# Patient Record
Sex: Female | Born: 1940 | Race: Asian | Hispanic: No | Marital: Married | Smoking: Never smoker
Health system: Southern US, Community
[De-identification: ages and names within clinical notes are randomized; demographics above are authoritative.]

## PROBLEM LIST (undated history)

## (undated) DIAGNOSIS — C801 Malignant (primary) neoplasm, unspecified: Secondary | ICD-10-CM

## (undated) DIAGNOSIS — M810 Age-related osteoporosis without current pathological fracture: Secondary | ICD-10-CM

## (undated) DIAGNOSIS — E079 Disorder of thyroid, unspecified: Secondary | ICD-10-CM

## (undated) DIAGNOSIS — T7840XA Allergy, unspecified, initial encounter: Secondary | ICD-10-CM

## (undated) DIAGNOSIS — E7800 Pure hypercholesterolemia, unspecified: Secondary | ICD-10-CM

## (undated) DIAGNOSIS — I10 Essential (primary) hypertension: Secondary | ICD-10-CM

## (undated) DIAGNOSIS — H269 Unspecified cataract: Secondary | ICD-10-CM

## (undated) DIAGNOSIS — K219 Gastro-esophageal reflux disease without esophagitis: Secondary | ICD-10-CM

## (undated) DIAGNOSIS — M1990 Unspecified osteoarthritis, unspecified site: Secondary | ICD-10-CM

## (undated) HISTORY — DX: Malignant (primary) neoplasm, unspecified: C80.1

## (undated) HISTORY — PX: ABDOMINAL HYSTERECTOMY: SHX81

## (undated) HISTORY — DX: Gastro-esophageal reflux disease without esophagitis: K21.9

## (undated) HISTORY — DX: Unspecified osteoarthritis, unspecified site: M19.90

## (undated) HISTORY — DX: Disorder of thyroid, unspecified: E07.9

## (undated) HISTORY — PX: EYE SURGERY: SHX253

## (undated) HISTORY — DX: Age-related osteoporosis without current pathological fracture: M81.0

## (undated) HISTORY — PX: BREAST BIOPSY: SHX20

## (undated) HISTORY — DX: Unspecified cataract: H26.9

## (undated) HISTORY — DX: Allergy, unspecified, initial encounter: T78.40XA

## (undated) HISTORY — PX: BREAST EXCISIONAL BIOPSY: SUR124

---

## 1998-08-22 ENCOUNTER — Other Ambulatory Visit: Admission: RE | Admit: 1998-08-22 | Discharge: 1998-08-22 | Payer: Self-pay | Admitting: Obstetrics and Gynecology

## 1998-09-19 ENCOUNTER — Ambulatory Visit (HOSPITAL_COMMUNITY): Admission: RE | Admit: 1998-09-19 | Discharge: 1998-09-19 | Payer: Self-pay | Admitting: Obstetrics and Gynecology

## 1999-09-10 ENCOUNTER — Other Ambulatory Visit: Admission: RE | Admit: 1999-09-10 | Discharge: 1999-09-10 | Payer: Self-pay | Admitting: Obstetrics and Gynecology

## 2001-01-18 ENCOUNTER — Ambulatory Visit (HOSPITAL_COMMUNITY): Admission: RE | Admit: 2001-01-18 | Discharge: 2001-01-18 | Payer: Self-pay | Admitting: Family Medicine

## 2001-01-18 ENCOUNTER — Encounter: Payer: Self-pay | Admitting: Family Medicine

## 2001-03-13 ENCOUNTER — Other Ambulatory Visit: Admission: RE | Admit: 2001-03-13 | Discharge: 2001-03-13 | Payer: Self-pay | Admitting: *Deleted

## 2001-12-27 ENCOUNTER — Inpatient Hospital Stay (HOSPITAL_COMMUNITY): Admission: EM | Admit: 2001-12-27 | Discharge: 2001-12-28 | Payer: Self-pay

## 2001-12-27 ENCOUNTER — Encounter: Payer: Self-pay | Admitting: *Deleted

## 2003-01-03 ENCOUNTER — Other Ambulatory Visit: Admission: RE | Admit: 2003-01-03 | Discharge: 2003-01-03 | Payer: Self-pay | Admitting: *Deleted

## 2005-09-14 ENCOUNTER — Encounter (INDEPENDENT_AMBULATORY_CARE_PROVIDER_SITE_OTHER): Payer: Self-pay | Admitting: *Deleted

## 2005-09-14 ENCOUNTER — Ambulatory Visit (HOSPITAL_COMMUNITY): Admission: RE | Admit: 2005-09-14 | Discharge: 2005-09-14 | Payer: Self-pay | Admitting: Gastroenterology

## 2005-09-23 ENCOUNTER — Emergency Department (HOSPITAL_COMMUNITY): Admission: EM | Admit: 2005-09-23 | Discharge: 2005-09-24 | Payer: Self-pay | Admitting: Emergency Medicine

## 2006-08-17 ENCOUNTER — Other Ambulatory Visit: Admission: RE | Admit: 2006-08-17 | Discharge: 2006-08-17 | Payer: Self-pay | Admitting: *Deleted

## 2007-12-27 ENCOUNTER — Encounter: Admission: RE | Admit: 2007-12-27 | Discharge: 2007-12-27 | Payer: Self-pay | Admitting: Family Medicine

## 2008-03-07 ENCOUNTER — Encounter: Admission: RE | Admit: 2008-03-07 | Discharge: 2008-03-07 | Payer: Self-pay | Admitting: Family Medicine

## 2008-10-08 ENCOUNTER — Encounter: Payer: Self-pay | Admitting: Cardiology

## 2008-11-25 ENCOUNTER — Encounter: Payer: Self-pay | Admitting: Cardiology

## 2008-12-30 ENCOUNTER — Encounter: Admission: RE | Admit: 2008-12-30 | Discharge: 2008-12-30 | Payer: Self-pay | Admitting: Family Medicine

## 2009-05-01 ENCOUNTER — Encounter: Payer: Self-pay | Admitting: Cardiology

## 2009-11-11 DIAGNOSIS — E785 Hyperlipidemia, unspecified: Secondary | ICD-10-CM | POA: Insufficient documentation

## 2009-11-11 DIAGNOSIS — I10 Essential (primary) hypertension: Secondary | ICD-10-CM | POA: Insufficient documentation

## 2009-11-12 ENCOUNTER — Ambulatory Visit: Payer: Self-pay | Admitting: Cardiology

## 2009-11-12 DIAGNOSIS — R0989 Other specified symptoms and signs involving the circulatory and respiratory systems: Secondary | ICD-10-CM | POA: Insufficient documentation

## 2009-11-12 DIAGNOSIS — I2510 Atherosclerotic heart disease of native coronary artery without angina pectoris: Secondary | ICD-10-CM | POA: Insufficient documentation

## 2009-11-13 ENCOUNTER — Telehealth: Payer: Self-pay | Admitting: Cardiology

## 2009-12-02 ENCOUNTER — Ambulatory Visit: Payer: Self-pay

## 2009-12-02 ENCOUNTER — Ambulatory Visit: Payer: Self-pay | Admitting: Cardiology

## 2009-12-02 ENCOUNTER — Encounter: Payer: Self-pay | Admitting: Cardiology

## 2009-12-02 ENCOUNTER — Encounter (HOSPITAL_COMMUNITY)
Admission: RE | Admit: 2009-12-02 | Discharge: 2010-02-10 | Payer: Self-pay | Source: Home / Self Care | Admitting: Cardiology

## 2010-01-02 ENCOUNTER — Encounter: Admission: RE | Admit: 2010-01-02 | Discharge: 2010-01-02 | Payer: Self-pay | Admitting: Family Medicine

## 2010-02-19 ENCOUNTER — Telehealth (INDEPENDENT_AMBULATORY_CARE_PROVIDER_SITE_OTHER): Payer: Self-pay | Admitting: *Deleted

## 2010-06-07 ENCOUNTER — Encounter: Payer: Self-pay | Admitting: Family Medicine

## 2010-06-14 LAB — CONVERTED CEMR LAB
ALT: 18 units/L (ref 0–35)
AST: 23 units/L (ref 0–37)
Albumin: 4.4 g/dL (ref 3.5–5.2)
Alkaline Phosphatase: 41 units/L (ref 39–117)
BUN: 13 mg/dL (ref 6–23)
Bilirubin, Direct: 0.2 mg/dL (ref 0.0–0.3)
CO2: 31 meq/L (ref 19–32)
Calcium: 9.1 mg/dL (ref 8.4–10.5)
Chloride: 105 meq/L (ref 96–112)
Cholesterol: 168 mg/dL (ref 0–200)
Creatinine, Ser: 0.7 mg/dL (ref 0.4–1.2)
GFR calc non Af Amer: 91.8 mL/min (ref >60)
Glucose, Bld: 85 mg/dL (ref 70–99)
HDL: 52.1 mg/dL (ref >39.00)
LDL Cholesterol: 79 mg/dL (ref 0–99)
Potassium: 4.2 meq/L (ref 3.5–5.1)
Sodium: 144 meq/L (ref 135–145)
Total Bilirubin: 0.5 mg/dL (ref 0.3–1.2)
Total CHOL/HDL Ratio: 3
Total Protein: 7.2 g/dL (ref 6.0–8.3)
Triglycerides: 183 mg/dL — ABNORMAL HIGH (ref 0.0–149.0)
VLDL: 36.6 mg/dL (ref 0.0–40.0)
Vit D, 25-Hydroxy: 30 ng/mL (ref 30–89)

## 2010-06-18 NOTE — Progress Notes (Signed)
Summary: son's cell  Phone Note Call from Patient   Caller: Patient Reason for Call: Talk to Doctor Summary of Call: Calling Dr Antoine Poche back to give him her sons cell Lalonnie Shaffer [phone] Initial call taken by: Migdalia Dk,  November 13, 2009 8:36 AM  Follow-up for Phone Call        Spoke with pt. Pt. states Dr. Antoine Poche asked her for her son's cell phone # yesterday during clinic visit. Pt's son  Cell phone # is [phone]. I let pt. know will send it  to MD's desktop. Follow-up by: Ollen Gross, RN, BSN,  November 13, 2009 9:31 AM

## 2010-06-18 NOTE — Progress Notes (Signed)
  Walk in Patient Form Recieved "pt nees lipitor filled" sent to [facility]  February 19, 2010 3:51 PM

## 2010-06-18 NOTE — Letter (Signed)
Summary: [facility] Progress [facility]  [facility] Progress Note   Imported By: Roderic Ovens 11/14/2009 09:38:47  _____________________________________________________________________  External Attachment:    Type:   Image     Comment:   External Document

## 2010-06-18 NOTE — Letter (Signed)
Summary: [name] Progress Note  [facility] Progress Note   Imported By: Roderic Ovens 11/14/2009 09:39:12  _____________________________________________________________________  External Attachment:    Type:   Image     Comment:   External Document

## 2010-06-18 NOTE — Assessment & Plan Note (Signed)
Summary: ec6/reestablish   Visit Type:  Initial Consult Primary Provider:  Dr. Juluis Rainier  CC:  CAD.  History of Present Illness: The patient presents for followup after an 8 year absence. She has a history of nonobstructive coronary artery disease. At the time of that evaluation she was having palpitations but she is no longer bothered by these. She is somewhat active. She walks the golf course 3 times per week with her husband. With this level of activity she gets no chest discomfort, neck or arm discomfort. She denies any palpitations, presyncope or syncope. She has no shortness of breath, PND or orthopnea. She said that since she retired the palpitations that bothered her in the past are no longer a problem.  Current Medications (verified): 1)  Enalapril Maleate 20 Mg Tabs (Enalapril Maleate) .Marland Kitchen.. 1 By Mouth Daily 2)  Metoprolol Succinate 25 Mg Xr24h-Tab (Metoprolol Succinate) .Marland Kitchen.. 1 P Odialy 3)  Lipitor 10 Mg Tabs (Atorvastatin Calcium) .Marland Kitchen.. 1 By Mouth Every Other Day 4)  Aspirin 81 Mg  Tabs (Aspirin) .Marland Kitchen.. 1 By Mouth Dialy 5)  Fish Oil   Oil (Fish Oil) .... 3 By Mouth Daily 6)  Vitamin C 500 Mg  Tabs (Ascorbic Acid) .Marland Kitchen.. 1 By Mouth Dialy 7)  Multivitamins   Tabs (Multiple Vitamin) .Marland Kitchen.. 1 By Mouth Daily  Allergies (verified): 1)  ! * Ivp Dye 2)  ! Epinephrine  Past History:  Past Medical History: Hypertension Dyslipidemia CAD (PDA 50% stenosis, diagonal 30% stenosis. Catheterization 2003)  Past Surgical History: Hysterectomy 1994.   Lumpectomy (benign)     Family History:  Father died in an auto accident.  Mother died at age 47 of  diabetes mellitus.  She has a brother, age 3, whose wife has a history of myocardial infarction, diabetes, and underwent CABG.  One sister, age 70, who died of myocardial infarction.  Another brother live and well, and two other sisters live and well without medical problems.  Social History:  Patient lives in [place] with her  husband.  She denies any alcohol, tobacco, or drug use.   Review of Systems       Positive for reflux, joint pains. Otherwise as stated in the history of present illness negative for all other systems.  Vital Signs:  Patient profile:   70 year old female Height:      65 inches Weight:      158 pounds BMI:     26.39 Pulse rate:   67 / minute Resp:     16 per minute BP sitting:   181 / 89  (right arm)  Vitals Entered By: Marrion Coy, CNA (November 12, 2009 11:47 AM)  Physical Exam  General:  Well developed, well nourished, in no acute distress. Head:  normocephalic and atraumatic Eyes:  PERRLA/EOM intact; conjunctiva and lids normal. Mouth:  Teeth, gums and palate normal. Oral mucosa normal. Neck:  Neck supple, no JVD. No masses, thyromegaly or abnormal cervical nodes. Chest Wall:  no deformities or breast masses noted Lungs:  Clear bilaterally to auscultation and percussion. Abdomen:  Bowel sounds positive; abdomen soft and non-tender without masses, organomegaly, or hernias noted. No hepatosplenomegaly. Msk:  Back normal, normal gait. Muscle strength and tone normal. Extremities:  No clubbing or cyanosis. Neurologic:  Alert and oriented x 3. Skin:  Intact without lesions or rashes. Cervical Nodes:  no significant adenopathy Axillary Nodes:  no significant adenopathy Inguinal Nodes:  no significant adenopathy Psych:  Normal affect.   Detailed Cardiovascular  Exam  Neck    Carotids: soft right carotid bruit    Neck Veins: Normal, no JVD.    Heart    Inspection: no deformities or lifts noted.      Palpation: normal PMI with no thrills palpable.      Auscultation: regular rate and rhythm, S1, S2 without murmurs, rubs, gallops, or clicks.    Vascular    Abdominal Aorta: no palpable masses, pulsations, or audible bruits.      Femoral Pulses: normal femoral pulses bilaterally.      Pedal Pulses: normal pedal pulses bilaterally.      Radial Pulses: normal radial pulses  bilaterally.      Peripheral Circulation: no clubbing, cyanosis, or edema noted with normal capillary refill.     EKG  Procedure date:  11/12/2009  Findings:      Sinus rhythm rate 67, axis within normal limits, QT slightly prolonged, no acute ST-T wave changes  Impression & Recommendations:  Problem # 1:  CORONARY ATHEROSCLEROSIS NATIVE CORONARY ARTERY (ICD-414.01) The patient has no new symptoms. I would like to screen her with an exercise treadmill test to rule out progression, risk stratify and give her a prescription for exercise. She will continue with risk reduction. Orders: EKG w/ Interpretation (93000) Treadmill (Treadmill)  Problem # 2:  CAROTID BRUIT (ICD-785.9) I will followup with carotid Doppler. Orders: Carotid Duplex (Carotid Duplex)  Problem # 3:  HYPERTENSION (ICD-401.9) Her blood pressure is slightly elevated. However, she reports it to be well controlled at home. I gave her instructions on how to keep a blood pressure diary. She will do this and presented to my office. Further management will be based on this. Orders: Treadmill (Treadmill) T-Assay of Vitamin D (04540-98119) TLB-BMP (Basic Metabolic Panel-BMET) (80048-METABOL)  Problem # 4:  DYSLIPIDEMIA (ICD-272.4) She asked for a repeat lipid profile. This was last checked in December. The HDL was 58 with an LDL of 79. This would be an acceptable ratio. I will share this information with her primary physician. Orders: TLB-Lipid Panel (80061-LIPID) TLB-Hepatic/Liver Function Pnl (80076-HEPATIC)  Patient Instructions: 1)  Your physician has requested that you have a carotid duplex. This test is an ultrasound of the carotid arteries in your neck. It looks at blood flow through these arteries that supply the brain with blood. Allow one hour for this exam. There are no restrictions or special instructions. 2)  Your physician has requested that you have an exercise tolerance test.  For further information please  visit [email or web address]  Please also follow instruction sheet, as given.

## 2010-06-18 NOTE — Letter (Signed)
Summary: [facility] Note   [facility] Progress Note   Imported By: Roderic Ovens 11/14/2009 09:39:42  _____________________________________________________________________  External Attachment:    Type:   Image     Comment:   External Document

## 2010-10-02 NOTE — Op Note (Signed)
Janice, [name] NO.:  [id number]   MEDICAL RECORD NO.:  [id number]          PATIENT TYPE:  AMB   LOCATION:  ENDO                         FACILITY:  [facility]   PHYSICIAN:  Bernette Redbird, M.D.   DATE OF BIRTH:  11-15-1940   DATE OF PROCEDURE:  09/14/2005  DATE OF DISCHARGE:                                 OPERATIVE REPORT   PROCEDURE:  Colonoscopy.   INDICATIONS:  Screening for colon cancer in a standard risk 70 year old  [place] immigrant.   FINDINGS:  Normal exam to the terminal ileum.   PROCEDURE:  The nature, purpose, and risks of the procedure had been  discussed with the patient who provided written consent.  Sedation for this  procedure and the upper endoscopy which preceded it totalled fentanyl 75 mcg  and Versed 7 mg IV without arrhythmias or desaturation.   The Olympus adjustable tension pediatric video colonoscope was advanced with  just some slight looping through the sigmoid region to the terminal ileum  which had a normal appearance and pullback was then performed.  The quality  of the prep was very good and it is felt that all areas were well seen.  This was a normal examination, specifically without evidence of polyps,  cancer, colitis, vascular malformations or diverticulosis.  Reinspection of  the rectum and a retroflexed view of the rectum were unremarkable.  No  biopsies were obtained.  The patient tolerated the procedure well and there  no apparent complications.   IMPRESSION:  Normal screening colonoscopy in a standard risk individual  (V76.51).   PLAN:  Repeat colonoscopy in ten years.           ______________________________  Bernette Redbird, M.D.     RB/MEDQ  D:  09/14/2005  T:  09/14/2005  Job:  [id number]   cc:   Susa Raring, M.D.

## 2010-10-02 NOTE — Op Note (Signed)
NAMEHAJER, DWYER NO.:  [id number]   MEDICAL RECORD NO.:  [id number]          PATIENT TYPE:  AMB   LOCATION:  ENDO                         FACILITY:  [facility]   PHYSICIAN:  Bernette Redbird, M.D.   DATE OF BIRTH:  04-12-1941   DATE OF PROCEDURE:  09/14/2005  DATE OF DISCHARGE:                                 OPERATIVE REPORT   PROCEDURE:  Upper endoscopy with biopsy.   INDICATIONS:  70 year old her [place] immigrant with nonspecific upper tract  discomfort or dyspepsia.   FINDINGS:  Unremarkable exam.  Small antral nodule and fundic polyp present.   PROCEDURE:  The nature, purpose and risks of the procedure have been  discussed with the patient who provided written consent.  Sedation was  fentanyl 50 mcg and Versed 3 mg IV without arrhythmias or desaturation.  The  Olympus standard adult video endoscope was passed under direct vision.  The  vocal cords were not well seen.  The esophagus was readily entered and had  entirely normal mucosa without evidence of reflux esophagitis, Barrett's  esophagus, varices, infection, neoplasia or any ring, stricture or hiatal  hernia.   The stomach contained no significant residual.  The mucosa was normal,  specifically without evidence of gastritis or erosions.   In the antrum was a roughly 4 mm aspect in the prepyloric region.  It did  not look pathologic but I took a couple of biopsies of it as added  reassurance.  Retroflexed viewing of the cardia was normal but there was a  roughly 4-mm polyp on a fold in the fundic region consistent with a fundic  gland polyp, of which several biopsies were obtained.  I also obtained  random antral biopsies looking for evidence of H pylori infection.   The pylorus, duodenal bulb and second duodenum looked normal.   The patient tolerated the procedure well and there were no apparent  complications.   IMPRESSION:  Essentially normal upper endoscopy.  No change in therapy is  mandated  based on the endoscopic findings, but if H. pylori is confirmed to  be present, consideration might be given to its treatment to help prevent  risks of gastric cancer which, in this [place] patient, might be higher  than the standard [place] patient.           ______________________________  Bernette Redbird, M.D.     RB/MEDQ  D:  09/14/2005  T:  09/14/2005  Job:  [id number]   cc:   Susa Raring, M.D.

## 2010-10-02 NOTE — Cardiovascular Report (Signed)
[name], [name]                            ACCOUNT NO.:  [id number]   MEDICAL RECORD NO.:  [id number]                   PATIENT TYPE:  INP   LOCATION:  2905                                 FACILITY:  [facility]   PHYSICIAN:  Everardo Beals. Juanda Chance, M.D. 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  Her son is a cardiology fellow at  [employer].  She has a history of hypertension, tachycardia, and  hyperlipidemia, and a positive family history for coronary disease.  She was  admitted with tightness in her chest and with a concern that this might  represent unstable angina.  Enzymes were negative.  Because of persistent  pain, she was scheduled for evaluation with catheterization today.   DESCRIPTION OF PROCEDURE:  The procedure was performed via the right femoral  artery using an arterial sheath and #6 [place] preformed coronary catheters.  A front wall arterial puncture was performed and Omnipaque contrast was  used.  A distal aortogram was performed to rule out renovascular causes for  hypertension.  She tolerated the procedure well and left the laboratory in  satisfactory condition.   RESULTS:  1. Left main coronary artery:  The left main coronary artery was free of     significant disease.  2. Left anterior descending artery:  The left anterior descending artery     gave rise to three septal perforators and two diagonal branches.  There     was 30% proximal and 30% mid stenosis in the LAD.  3. Circumflex artery:  The circumflex artery gave rise to an intermedius     branch, an atrial branch, a small marginal branch, and two posterolateral     branches.  The circumflex is irregular but there is no significant     obstruction.  4. Right coronary artery:  The right  coronary artery is a moderate-sized     vessel that gave rise to a conus branch, a right ventricular branch, a     posterior descending branch, and three posterolateral branches.  There is     irregularity in the proximal portion of the vessel.  There was 50%     narrowing at the ostium of the posterior descending branch.  5. Left ventriculogram:  The left ventriculogram, performed in the RAO     projection, showed good wall motion with no areas of hypokinesis.  The     estimated ejection fraction was 60%.  6. Distal aortogram:  A distal aortogram was performed which showed patent     renal arteries and no significant aortoiliac obstruction.  7. The aortic pressure was 150/72 with a mean of 103.  The left ventricular  pressure was 150/13.   CONCLUSION:  Nonobstructive coronary artery disease with 30% proximal and  30% mid stenosis in the left anterior descending artery, irregularities in  the circumflex artery, 50% narrowing at the ostium of the posterior  descending branch of the right coronary artery, and normal left ventricular  function.   RECOMMENDATIONS:  The lesions do not appear to be flow-limiting and seem  unlikely to be responsible for the patient's symptoms.  We will plan  continued secondary risk factor modification.  I spoke with Dr. Antoine Poche who  thinks the symptoms may be GI, and I will talk to the patient further  regarding this and will probably start her on a proton pump inhibitor along  with her admission medications plus aspirin now that she has documented  nonobstructive coronary disease.  I will arrange followup with Dr. Dayton Scrape.                                                 Bruce Elvera Lennox Juanda Chance, M.D. [facility]    BRB/MEDQ  D:  12/28/2001  T:  12/31/2001  Job:  [id number]   cc:   Meredith Staggers, M.D.   Veneda Melter, M.D. [facility]   Cardiac Catheterization Lab

## 2010-10-02 NOTE — H&P (Signed)
[name], [name]                            ACCOUNT NO.:  [id number]   MEDICAL RECORD NO.:  [id number]                   PATIENT TYPE:  INP   LOCATION:  2905                                 FACILITY:  [facility]   PHYSICIAN:  Veneda Melter, M.D. [facility]               DATE OF BIRTH:  [birth year]   DATE OF ADMISSION:  12/26/2001  DATE OF DISCHARGE:  12/28/2001                                HISTORY & PHYSICAL   CHIEF COMPLAINT:  Chest pressure.   HISTORY:  The patient is a 70 year old Asian female with history of  hypertension and dyslipidemia, who presents today with complaints of  substernal chest pressure and mild shortness of breath.  The patient noted  some discomfort in her mid-sternum last night while walking.  The patient  thought nothing of this and went to sleep, although she had somewhat of a  restless night.  She did not feel quite well.  During the day today while  working approximately at noon, she had onset of chest pressure again with  some mild shortness of breath and light-headedness.  This lasted  approximately two hours.  Patient left work early, went home and slept.  Later upon awakening at approximately dinnertime, still felt somewhat short  of breath as though she could not catch her breath.  She presented to [facility].  She was found to be significantly hypertensive and was  referred to [facility] emergency room.  Currently the patient complains of mild  mid-sternal chest pressure, no shortness of breath, no light-headedness.  She denies any nausea and vomiting,  no diaphoresis, no recent history of  neurologic deficits.  She denies any recent ______________, no hematuria,  dysuria, fever, chills or other constitutional symptoms.  She has not had  any exertional chest pain in the recent past, shortness of breath,  orthopnea, paroxysmal nocturnal dyspnea.   REVIEW OF SYSTEMS:  Otherwise noncontributory.   PAST MEDICAL HISTORY:  Noted for hysterectomy in 1994.   She has been on  Premarin since for significant perimenopausal symptoms.  Lumpectomy  approximately five years ago, which was benign.  Hypertension and  dyslipidemia.   ALLERGIES:  None.   CURRENT MEDICATIONS:  1. Altace 7.5 mg q.d.  2. Lipitor 10 mg q.d.  3. Flax seed oil.  4. Omega-3 fish oils.  5. Premarin 0.3 mg q.d.  6. Vitamin-E 200 units q.d.   SOCIAL HISTORY:  Patient lives in [place] with her husband.  Her husband  is a patient of Dr. Antoine Poche.  She denies any alcohol, tobacco, or drug use.  She has a son who is a cardiology fellow at [employer].   FAMILY HISTORY:  Father died in an auto accident.  Mother died at age 83 of  diabetes mellitus.  She has a brother, age 3, whose wife has a history of  myocardial infarction, diabetes, and underwent CABG.  One sister,  age 66,  who died of myocardial infarction.  Another brother live and well, and two  other sisters live and well without medical problems.   PHYSICAL EXAMINATION:  GENERAL:  She is a well-developed well-nourished  white female, somewhat anxious, but in no acute distress.  VITAL SIGNS:  Temperature 99.2.  Blood pressure 223/104.  Pulse 95.  Respirations 20.  O2 saturation is 99% on room air.  HEENT:  Pupils equal, round, reactive to light.  Extraocular muscles are  intact.  Funduscopic exam shows no hemorrhages or exudates or edema.  Oropharynx clear.  NECK:  Supple, no adenopathy, JVD is normal.  HEART:  Regular rate without murmurs.  LUNGS:  Clear to auscultation.  ABDOMEN:  Soft, nontender.  EXTREMITIES:  No peripheral edema.  Peripheral pulses are 2-3+ and equal  bilaterally.  Motor and sensory is intact to touch.   LABORATORY DATA:  She had a white count of 4.7, hemoglobin 14.8, hematocrit  41.5.  Platelets 291.  Sodium is 140.  Potassium 3.5.  Chloride 2.  Bicarb  28.  BUN 10, creatinine 0.8.  Glucose 149.  Liver function tests within  normal limits.  Initial CK is 101.  EKG is normal sinus at 88. No  acute  ischemic changes. Chest x-ray shows normal cardiac silhouette.  There is  some mild fullness in the right hilum.  No acute infiltrates or effusion  present.   ASSESSMENT/PLAN:  The patient is 70 year old white female with history of  hypertension and dyslipidemia, who presents with substernal chest pressure  and shortness of breath.  The patient is significantly hypertensive, and  this may be the cause of her symptoms.  Aggressive therapy will be pursued  to control this, with ace inhibitors, beta blockers, and possibly  vasodilators.  The patient may also benefit from a diuretic for long term  control of her blood pressure.  She also will be anticoagulated with  aspirin, and consideration given toward the addition of low-mercury weight  heparin should her symptoms persist after control of her blood pressure.  Will admit the patient to telemetry to rule out acute myocardial infarction  with serial enzymes and ECG.  Should the patient have evidence of injury or  ischemia, cardiac catheterization will be performed.  Given her family  history of coronary disease and her risk factors, as well as high  probability for coronary disease, further ischemic assessment would be  prudent, most notably with cardiac catheterization versus stress imaging  study.  Patient is somewhat reluctant to proceed with cardiac  catheterization and we will reserve this for any high risk features.  Otherwise, should she rule out, will proceed with stress imaging study.  Also, will repeat the patient's hypokalemia and further attention to  patient's perimenopausal symptoms should be addressed in the near future, as  patient will be at high risk for cardiovascular events on Premarin, and  consideration should be given towards discontinuing this, and perhaps  treating her with selective serotonin reuptake inhibitors.  Will also obtain a fasting lipid profile to ensure that the LDL is under control, as well as   a hemoglobin A1c to rule out diabetes, given her hyperglycemia.  The patient  also has nonspecific fullness in the right hilum, that requires further  assessment and a full lateral chest x-ray should be done in the near future.  Will also try to obtain results of prior films, perhaps done through her  primary care physician.  Treatment options were discussed with the  patient,  her husband and son, and they understand the risks, benefits and  alternatives of stress imaging testing and coronary angiography and the  ensuing ramifications.                                                Veneda Melter, M.D. [facility]    NG/MEDQ  D:  12/27/2001  T:  12/29/2001  Job:  [id number]   cc:   Meredith Staggers, M.D.

## 2010-12-04 ENCOUNTER — Other Ambulatory Visit: Payer: Self-pay | Admitting: Family Medicine

## 2010-12-04 DIAGNOSIS — Z1231 Encounter for screening mammogram for malignant neoplasm of breast: Secondary | ICD-10-CM

## 2011-01-04 ENCOUNTER — Ambulatory Visit
Admission: RE | Admit: 2011-01-04 | Discharge: 2011-01-04 | Disposition: A | Discharge status: Home / Self Care | Payer: BC Managed Care – PPO | Source: Ambulatory Visit | Attending: Family Medicine | Admitting: Family Medicine

## 2011-01-04 DIAGNOSIS — Z1231 Encounter for screening mammogram for malignant neoplasm of breast: Secondary | ICD-10-CM

## 2011-06-02 ENCOUNTER — Other Ambulatory Visit: Payer: Self-pay | Admitting: Family Medicine

## 2011-06-02 DIAGNOSIS — Z780 Asymptomatic menopausal state: Secondary | ICD-10-CM

## 2011-06-25 ENCOUNTER — Emergency Department (HOSPITAL_BASED_OUTPATIENT_CLINIC_OR_DEPARTMENT_OTHER)
Admission: EM | Admit: 2011-06-25 | Discharge: 2011-06-25 | Disposition: A | Discharge status: Home / Self Care | Payer: Medicare Other | Attending: Emergency Medicine | Admitting: Emergency Medicine

## 2011-06-25 ENCOUNTER — Encounter (HOSPITAL_BASED_OUTPATIENT_CLINIC_OR_DEPARTMENT_OTHER): Payer: Self-pay

## 2011-06-25 ENCOUNTER — Other Ambulatory Visit: Payer: Self-pay

## 2011-06-25 DIAGNOSIS — Z79899 Other long term (current) drug therapy: Secondary | ICD-10-CM | POA: Insufficient documentation

## 2011-06-25 DIAGNOSIS — R109 Unspecified abdominal pain: Secondary | ICD-10-CM | POA: Insufficient documentation

## 2011-06-25 DIAGNOSIS — E7800 Pure hypercholesterolemia, unspecified: Secondary | ICD-10-CM | POA: Insufficient documentation

## 2011-06-25 DIAGNOSIS — R112 Nausea with vomiting, unspecified: Secondary | ICD-10-CM | POA: Insufficient documentation

## 2011-06-25 DIAGNOSIS — I10 Essential (primary) hypertension: Secondary | ICD-10-CM | POA: Insufficient documentation

## 2011-06-25 HISTORY — DX: Pure hypercholesterolemia, unspecified: E78.00

## 2011-06-25 HISTORY — DX: Essential (primary) hypertension: I10

## 2011-06-25 LAB — COMPREHENSIVE METABOLIC PANEL
ALT: 20 U/L (ref 0–35)
AST: 25 U/L (ref 0–37)
Albumin: 4.1 g/dL (ref 3.5–5.2)
Alkaline Phosphatase: 46 U/L (ref 39–117)
BUN: 16 mg/dL (ref 6–23)
CO2: 27 mEq/L (ref 19–32)
Calcium: 9.1 mg/dL (ref 8.4–10.5)
Chloride: 98 mEq/L (ref 96–112)
Creatinine, Ser: 0.7 mg/dL (ref 0.50–1.10)
GFR calc Af Amer: >90 mL/min (ref >90)
GFR calc non Af Amer: 87 mL/min — ABNORMAL LOW (ref >90)
Glucose, Bld: 107 mg/dL — ABNORMAL HIGH (ref 70–99)
Potassium: 4 mEq/L (ref 3.5–5.1)
Sodium: 136 mEq/L (ref 135–145)
Total Bilirubin: 0.6 mg/dL (ref 0.3–1.2)
Total Protein: 7.5 g/dL (ref 6.0–8.3)

## 2011-06-25 LAB — CARDIAC PANEL(CRET KIN+CKTOT+MB+TROPI)
CK, MB: 1.7 ng/mL (ref 0.3–4.0)
Relative Index: INVALID (ref 0.0–2.5)
Total CK: 96 U/L (ref 7–177)
Troponin I: <0.3 ng/mL (ref <0.30)

## 2011-06-25 LAB — CBC
HCT: 40.7 % (ref 36.0–46.0)
Hemoglobin: 14.2 g/dL (ref 12.0–15.0)
MCH: 29.9 pg (ref 26.0–34.0)
MCHC: 34.9 g/dL (ref 30.0–36.0)
MCV: 85.7 fL (ref 78.0–100.0)
Platelets: 210 10*3/uL (ref 150–400)
RBC: 4.75 MIL/uL (ref 3.87–5.11)
RDW: 12.6 % (ref 11.5–15.5)
WBC: 8.2 10*3/uL (ref 4.0–10.5)

## 2011-06-25 LAB — URINALYSIS, ROUTINE W REFLEX MICROSCOPIC
Bilirubin Urine: NEGATIVE
Glucose, UA: NEGATIVE mg/dL
Hgb urine dipstick: NEGATIVE
Ketones, ur: NEGATIVE mg/dL
Leukocytes, UA: NEGATIVE
Nitrite: NEGATIVE
Protein, ur: NEGATIVE mg/dL
Specific Gravity, Urine: 1.013 (ref 1.005–1.030)
Urobilinogen, UA: 0.2 mg/dL (ref 0.0–1.0)
pH: 6 (ref 5.0–8.0)

## 2011-06-25 LAB — DIFFERENTIAL
Basophils Absolute: 0 10*3/uL (ref 0.0–0.1)
Basophils Relative: 0 % (ref 0–1)
Eosinophils Absolute: 0 10*3/uL (ref 0.0–0.7)
Eosinophils Relative: 0 % (ref 0–5)
Lymphocytes Relative: 8 % — ABNORMAL LOW (ref 12–46)
Lymphs Abs: 0.7 10*3/uL (ref 0.7–4.0)
Monocytes Absolute: 0.3 10*3/uL (ref 0.1–1.0)
Monocytes Relative: 3 % (ref 3–12)
Neutro Abs: 7.3 10*3/uL (ref 1.7–7.7)
Neutrophils Relative %: 89 % — ABNORMAL HIGH (ref 43–77)

## 2011-06-25 LAB — LIPASE, BLOOD: Lipase: 27 U/L (ref 11–59)

## 2011-06-25 MED ORDER — ONDANSETRON 4 MG PO TBDP: 4.0000 mg | ORAL_TABLET | Freq: Three times a day (TID) | ORAL | Status: AC | PRN

## 2011-06-25 MED ORDER — SODIUM CHLORIDE 0.9 % IV SOLN
Freq: Once | INTRAVENOUS | Status: AC
  Administered 2011-06-25: 18:00:00 via INTRAVENOUS

## 2011-06-25 MED ORDER — ONDANSETRON HCL 4 MG/2ML IJ SOLN
4.0000 mg | Freq: Once | INTRAMUSCULAR | Status: DC
  Filled 2011-06-25: qty 2

## 2011-06-25 MED ORDER — ONDANSETRON 4 MG PO TBDP
4.0000 mg | ORAL_TABLET | Freq: Once | ORAL | Status: AC
  Administered 2011-06-25: 4 mg via ORAL
  Filled 2011-06-25: qty 1

## 2011-06-25 NOTE — ED Provider Notes (Signed)
History     CSN: [id number]  Arrival date & time 06/25/11  1615   First MD Initiated Contact with Patient 06/25/11 1649      Chief Complaint  Patient presents with  . Heartburn    (Consider location/radiation/quality/duration/timing/severity/associated sxs/prior treatment) Patient is a 71 y.o. female presenting with abdominal pain. The history is provided by the patient. No language interpreter was used.  Abdominal Pain The primary symptoms of the illness include abdominal pain, nausea and vomiting. The current episode started yesterday. The onset of the illness was gradual. The problem has been gradually worsening.  The vomiting began today. Vomiting occurs 6 to 10 times per day. The emesis contains stomach contents.  The illness is associated with eating. The patient states that she believes she is currently not pregnant. The patient has had a change in bowel habit. Additional symptoms associated with the illness include heartburn. Symptoms associated with the illness do not include chills or constipation. Significant associated medical issues do not include PUD, GERD, gallstones, liver disease or diverticulitis.  Pt reports she began having vomitting this morning.  Pt reports she has taken several dosages of pepto without relief.  Past Medical History  Diagnosis Date  . Hypertension   . High cholesterol     Past Surgical History  Procedure Date  . Abdominal hysterectomy     No family history on file.  History  Substance Use Topics  . Smoking status: Never Smoker   . Smokeless tobacco: Not on file  . Alcohol Use: No    OB History    Grav Para Term Preterm Abortions TAB SAB Ect Mult Living                  Review of Systems  Constitutional: Negative for chills.  Gastrointestinal: Positive for heartburn, nausea, vomiting and abdominal pain. Negative for constipation.  All other systems reviewed and are negative.    Allergies  Epinephrine and Ivp dye  Home  Medications   Current Outpatient Rx  Name Route Sig Dispense Refill  . ATORVASTATIN CALCIUM 10 MG PO TABS Oral Take 10 mg by mouth every other day.    . SUPER B COMPLEX PO TABS Oral Take 1 tablet by mouth daily.    Marland Kitchen BISMUTH SUBSALICYLATE 262 MG PO CHEW Oral Chew 262 mg by mouth once.    Marland Kitchen VITAMIN D 1000 UNITS PO TABS Oral Take 1,000 Units by mouth daily.    . ENALAPRIL MALEATE 20 MG PO TABS Oral Take 20 mg by mouth daily.    . OMEGA-3 FATTY ACIDS 1000 MG PO CAPS Oral Take 1 g by mouth daily.    Marland Kitchen FLAXSEED OIL 1000 MG PO CAPS Oral Take 1,000 mg by mouth daily.    Marland Kitchen METOPROLOL SUCCINATE ER 25 MG PO TB24 Oral Take 25 mg by mouth daily.    . ADULT MULTIVITAMIN W/MINERALS CH Oral Take 1 tablet by mouth daily.    Marland Kitchen RANITIDINE HCL 75 MG PO TABS Oral Take 75 mg by mouth once.      BP 173/77  Pulse 95  Temp(Src) 98.4 F (36.9 C) (Oral)  Resp 17  Ht 5\' 5"  (1.651 m)  Wt 156 lb (70.761 kg)  BMI 25.96 kg/m2  SpO2 99%  Physical Exam  Nursing note and vitals reviewed. Constitutional: She is oriented to person, place, and time. She appears well-developed and well-nourished.  HENT:  Head: Normocephalic and atraumatic.  Right Ear: External ear normal.  Left Ear: External ear  normal.  Nose: Nose normal.  Mouth/Throat: Oropharynx is clear and moist.  Eyes: Conjunctivae and EOM are normal. Pupils are equal, round, and reactive to light.  Neck: Normal range of motion. Neck supple.  Cardiovascular: Normal rate and normal heart sounds.   Pulmonary/Chest: Effort normal and breath sounds normal.  Abdominal: Soft. There is tenderness.  Musculoskeletal: Normal range of motion.  Neurological: She is alert and oriented to person, place, and time. She has normal reflexes.  Skin: Skin is warm.  Psychiatric: She has a normal mood and affect.    ED Course  Procedures (including critical care time)  Labs Reviewed  DIFFERENTIAL - Abnormal; Notable for the following:    Neutrophils Relative 89 (*)     Lymphocytes Relative 8 (*)    All other components within normal limits  COMPREHENSIVE METABOLIC PANEL - Abnormal; Notable for the following:    Glucose, Bld 107 (*)    GFR calc non Af Amer 87 (*)    All other components within normal limits  CBC  LIPASE, BLOOD  CARDIAC PANEL(CRET KIN+CKTOT+MB+TROPI)  URINALYSIS, ROUTINE W REFLEX MICROSCOPIC   No results found.   No diagnosis found.    MDM  Pt given zofran po.  Pt given IV fluids,  Labs returned.  Dr. Judd Lien in to see and examine pt.    Pt feels some better.  I scheduled pt for ultrasound on Monday.  Pt to see Dr. Zachery Dauer for recheck on Monday.       Langston Masker, [place] 06/26/11 1550  [place], [place] 06/26/11 1554  [place], [place] 06/26/11 1608  Langston Masker, [place] 06/26/11 [id number]

## 2011-06-25 NOTE — ED Notes (Signed)
Pt states "I need to go home I have a small puppy."

## 2011-06-25 NOTE — ED Notes (Signed)
Pt refused IV zofran and sts "I do not like having medicine in my veins". Pt requested po nausea medication. Pt also sts she does not want a CT. Trisha Mangle made aware of pt's requests.

## 2011-06-25 NOTE — ED Notes (Signed)
C/o indigestion/heart burn started yesterday after eating fast food-later had cold sweats last night-c/o nausea with decreased appetite, "heartburn pain" to epigastric area

## 2011-06-26 ENCOUNTER — Emergency Department (HOSPITAL_COMMUNITY): Payer: Medicare Other

## 2011-06-26 ENCOUNTER — Ambulatory Visit (HOSPITAL_BASED_OUTPATIENT_CLINIC_OR_DEPARTMENT_OTHER)
Admission: RE | Admit: 2011-06-26 | Discharge: 2011-06-26 | Disposition: A | Discharge status: Home / Self Care | Payer: Medicare Other | Source: Ambulatory Visit | Attending: Physician Assistant | Admitting: Physician Assistant

## 2011-06-26 ENCOUNTER — Emergency Department (HOSPITAL_COMMUNITY)
Admission: EM | Admit: 2011-06-26 | Discharge: 2011-06-26 | Disposition: A | Discharge status: Home / Self Care | Payer: Medicare Other | Attending: Emergency Medicine | Admitting: Emergency Medicine

## 2011-06-26 ENCOUNTER — Encounter (HOSPITAL_COMMUNITY): Payer: Self-pay | Admitting: Emergency Medicine

## 2011-06-26 DIAGNOSIS — I10 Essential (primary) hypertension: Secondary | ICD-10-CM | POA: Insufficient documentation

## 2011-06-26 DIAGNOSIS — E7800 Pure hypercholesterolemia, unspecified: Secondary | ICD-10-CM | POA: Insufficient documentation

## 2011-06-26 DIAGNOSIS — R509 Fever, unspecified: Secondary | ICD-10-CM | POA: Insufficient documentation

## 2011-06-26 DIAGNOSIS — R110 Nausea: Secondary | ICD-10-CM

## 2011-06-26 DIAGNOSIS — R112 Nausea with vomiting, unspecified: Secondary | ICD-10-CM | POA: Insufficient documentation

## 2011-06-26 DIAGNOSIS — R109 Unspecified abdominal pain: Secondary | ICD-10-CM | POA: Insufficient documentation

## 2011-06-26 DIAGNOSIS — Z79899 Other long term (current) drug therapy: Secondary | ICD-10-CM | POA: Insufficient documentation

## 2011-06-26 MED ORDER — ONDANSETRON 4 MG PO TBDP
4.0000 mg | ORAL_TABLET | Freq: Once | ORAL | Status: AC
  Administered 2011-06-26: 4 mg via ORAL
  Filled 2011-06-26: qty 1

## 2011-06-26 MED ORDER — ACETAMINOPHEN 325 MG PO TABS
650.0000 mg | ORAL_TABLET | Freq: Once | ORAL | Status: AC
  Administered 2011-06-26: 650 mg via ORAL
  Filled 2011-06-26: qty 2

## 2011-06-26 MED ORDER — TRAMADOL HCL 50 MG PO TABS: 50.0000 mg | ORAL_TABLET | Freq: Four times a day (QID) | ORAL | Status: AC | PRN

## 2011-06-26 NOTE — ED Notes (Addendum)
Pt c/o low grade fever, chills, headache, muscle aches, fatigue, nausea, and severe epigastric pain.  Pt denies vomiting or diarrhea.  Pt was seen at [facility] yesterday and was given medication for nausea.  Pt states all lab work was normal but she was unable to have u/s of abd because it was after hours.

## 2011-06-26 NOTE — ED Provider Notes (Signed)
History     CSN: [id number]  Arrival date & time 06/26/11  1216   First MD Initiated Contact with Patient 06/26/11 1231      Chief Complaint  Patient presents with  . Fever  . Abdominal Pain    (Consider location/radiation/quality/duration/timing/severity/associated sxs/prior treatment) Patient is a 71 y.o. female presenting with fever and abdominal pain. The history is provided by the patient.  Fever Primary symptoms of the febrile illness include fever, abdominal pain and nausea. Primary symptoms do not include headaches, shortness of breath, vomiting, diarrhea or rash.  Abdominal Pain The primary symptoms of the illness include abdominal pain, fever and nausea. The primary symptoms of the illness do not include shortness of breath, vomiting or diarrhea.  Symptoms associated with the illness do not include back pain.   patient has had upper abdominal pain for last few days. She was seen in meds in [facility] yesterday for same thing. Lab work was done and was overall reassuring. She's had nausea some vomiting. No cough. No diarrhea. No constipation. She states she is hungry, but when she looks at food makes her nauseous. She states it is like morning sickness. She states she has had fevers at home, but did not have them at the doctor yesterday. She states she could not get an ultrasound done yesterday because he was after hours. She has not had pains like this before.  Past Medical History  Diagnosis Date  . Hypertension   . High cholesterol     Past Surgical History  Procedure Date  . Abdominal hysterectomy     History reviewed. No pertinent family history.  History  Substance Use Topics  . Smoking status: Never Smoker   . Smokeless tobacco: Not on file  . Alcohol Use: No    OB History    Grav Para Term Preterm Abortions TAB SAB Ect Mult Living                  Review of Systems  Constitutional: Positive for fever. Negative for activity change and appetite change.   HENT: Negative for neck stiffness.   Eyes: Negative for pain.  Respiratory: Negative for chest tightness and shortness of breath.   Cardiovascular: Negative for chest pain and leg swelling.  Gastrointestinal: Positive for nausea and abdominal pain. Negative for vomiting and diarrhea.  Genitourinary: Negative for flank pain.  Musculoskeletal: Negative for back pain.  Skin: Negative for rash.  Neurological: Negative for weakness, numbness and headaches.  Psychiatric/Behavioral: Negative for behavioral problems.    Allergies  Epinephrine and Ivp dye  Home Medications   Current Outpatient Rx  Name Route Sig Dispense Refill  . ATORVASTATIN CALCIUM 10 MG PO TABS Oral Take 10 mg by mouth every other day.    . SUPER B COMPLEX PO TABS Oral Take 1 tablet by mouth daily.    Marland Kitchen BISMUTH SUBSALICYLATE 262 MG PO CHEW Oral Chew 262 mg by mouth once.    Marland Kitchen VITAMIN D 1000 UNITS PO TABS Oral Take 1,000 Units by mouth daily.    . ENALAPRIL MALEATE 20 MG PO TABS Oral Take 20 mg by mouth daily.    . OMEGA-3 FATTY ACIDS 1000 MG PO CAPS Oral Take 1 g by mouth daily.    Marland Kitchen FLAXSEED OIL 1000 MG PO CAPS Oral Take 1,000 mg by mouth daily.    Marland Kitchen METOPROLOL SUCCINATE ER 25 MG PO TB24 Oral Take 25 mg by mouth daily.    . ADULT MULTIVITAMIN W/MINERALS [facility]  Oral Take 1 tablet by mouth daily.    Marland Kitchen ONDANSETRON 4 MG PO TBDP Oral Take 1 tablet (4 mg total) by mouth every 8 (eight) hours as needed for nausea. 10 tablet 0  . RANITIDINE HCL 75 MG PO TABS Oral Take 75 mg by mouth once.    . TRAMADOL HCL 50 MG PO TABS Oral Take 1 tablet (50 mg total) by mouth every 6 (six) hours as needed for pain. 8 tablet 0    BP 142/68  Pulse 92  Temp(Src) 100 F (37.8 C) (Oral)  Resp 16  Ht 5\' 5"  (1.651 m)  Wt 156 lb (70.761 kg)  BMI 25.96 kg/m2  SpO2 98%  Physical Exam  Nursing note and vitals reviewed. Constitutional: She is oriented to person, place, and time. She appears well-developed and well-nourished.        temperature  100.0  HENT:  Head: Normocephalic and atraumatic.  Eyes: EOM are normal. Pupils are equal, round, and reactive to light.  Neck: Normal range of motion. Neck supple.  Cardiovascular: Normal rate, regular rhythm and normal heart sounds.   No murmur heard. Pulmonary/Chest: Effort normal and breath sounds normal. No respiratory distress. She has no wheezes. She has no rales.  Abdominal: Soft. Bowel sounds are normal. She exhibits no distension. There is no tenderness. There is no rebound and no guarding.  Musculoskeletal: Normal range of motion.  Neurological: She is alert and oriented to person, place, and time. No cranial nerve deficit.  Skin: Skin is warm and dry.  Psychiatric: She has a normal mood and affect. Her speech is normal.    ED Course  Procedures (including critical care time)  Labs Reviewed - No data to display US Abdomen Complete  06/26/2011  *RADIOLOGY REPORT*  Clinical Data:  Fever, abdominal pain  COMPLETE ABDOMINAL ULTRASOUND  Comparison:  Renal ultrasound 04/21/2010  Findings:  Gallbladder:  Small amount of layering gallbladder sludge is noted. No shadowing gallstones are noted.  No thickening of the gallbladder wall or sonographic Murphy's sign.  Common bile duct:  Measures 3.6 mm in diameter.  Liver:  No focal lesion identified.  Within normal limits in parenchymal echogenicity.  IVC:  Appears normal.  Pancreas:  No focal abnormality seen.  Spleen:  Measures 4.6 cm in length.  Normal echogenicity.  Right Kidney:  Measures 10.2 cm in length.  No mass, hydronephrosis or diagnostic renal calculus  Left Kidney:  Measures 10.4 cm in length.  No mass, hydronephrosis or diagnostic renal calculus  Abdominal aorta:  No aneurysm identified. Measures up to 1.9 cm in diameter.  IMPRESSION:  1.  Small amount of gallbladder sludge.  No shadowing gallstones are noted within gallbladder. 2.  Normal CBD. 3.  No hydronephrosis or diagnostic renal calculus.  Original Report Authenticated By: Natasha Mead, M.D.     1. Abdominal pain   2. Nausea       MDM  Epigastric abdominal pain with nausea since. Seen yesterday for the same. Lab work is reassuring at that time. She is unable to get ultrasound done. Ultrasound was done now and just got out biliary sludge without other signs of cholecystitis. Her pain was relieved by Tylenol. I discussed the case with her son, who is emergency physician in [place]. The patient will has anti-emetics. She'll followup as needed.        Juliet Rude. Rubin Payor, MD 06/26/11 1525

## 2011-06-26 NOTE — ED Provider Notes (Signed)
Medical screening examination/treatment/procedure(s) were conducted as a shared visit with non-physician practitioner(s) and myself.  I personally evaluated the patient during the encounter.  The patient presents with epigastric discomfort since yesterday which has gradually worsened.  On exam, she is ttp in the epigastrium without rebound or guarding.  The heart and lung exam is normal.  Workup reveals no evidence for a cardiac etiology.  This could possibly be gallbladder-related.  Will discharge to home, with order for an [place] on Monday.  To return prn if worsens.  Geoffery Lyons, MD 06/26/11 1929

## 2011-06-28 ENCOUNTER — Other Ambulatory Visit (HOSPITAL_BASED_OUTPATIENT_CLINIC_OR_DEPARTMENT_OTHER): Payer: BC Managed Care – PPO

## 2011-06-30 ENCOUNTER — Other Ambulatory Visit: Payer: BC Managed Care – PPO

## 2011-07-09 ENCOUNTER — Other Ambulatory Visit: Payer: BC Managed Care – PPO

## 2011-07-15 ENCOUNTER — Ambulatory Visit
Admission: RE | Admit: 2011-07-15 | Discharge: 2011-07-15 | Disposition: A | Discharge status: Home / Self Care | Payer: BC Managed Care – PPO | Source: Ambulatory Visit | Attending: Family Medicine | Admitting: Family Medicine

## 2011-07-15 DIAGNOSIS — Z780 Asymptomatic menopausal state: Secondary | ICD-10-CM

## 2011-08-17 ENCOUNTER — Other Ambulatory Visit: Payer: Self-pay

## 2011-08-17 MED ORDER — ATORVASTATIN CALCIUM 10 MG PO TABS: 10.0000 mg | ORAL_TABLET | ORAL | Status: DC

## 2011-08-17 NOTE — Telephone Encounter (Signed)
..   Requested Prescriptions   Signed Prescriptions Disp Refills  . atorvastatin (LIPITOR) 10 MG tablet 30 tablet 0    Sig: Take 1 tablet (10 mg total) by mouth every other day.    Authorizing Provider: Rollene Rotunda    Ordering User: Christella Hartigan, Sho Salguero M  Patient needs to make an appointment with Dr Antoine Poche or get meds refilled by her pcp per Elita Quick

## 2011-11-19 ENCOUNTER — Other Ambulatory Visit: Payer: Self-pay | Admitting: Family Medicine

## 2011-11-19 DIAGNOSIS — Z1231 Encounter for screening mammogram for malignant neoplasm of breast: Secondary | ICD-10-CM

## 2012-01-10 ENCOUNTER — Ambulatory Visit
Admission: RE | Admit: 2012-01-10 | Discharge: 2012-01-10 | Disposition: A | Discharge status: Home / Self Care | Payer: Medicare Other | Source: Ambulatory Visit | Attending: Family Medicine | Admitting: Family Medicine

## 2012-01-10 DIAGNOSIS — Z1231 Encounter for screening mammogram for malignant neoplasm of breast: Secondary | ICD-10-CM

## 2012-01-13 ENCOUNTER — Other Ambulatory Visit: Payer: Self-pay | Admitting: Family Medicine

## 2012-01-13 DIAGNOSIS — R928 Other abnormal and inconclusive findings on diagnostic imaging of breast: Secondary | ICD-10-CM

## 2012-01-18 ENCOUNTER — Ambulatory Visit
Admission: RE | Admit: 2012-01-18 | Discharge: 2012-01-18 | Disposition: A | Discharge status: Home / Self Care | Payer: Medicare Other | Source: Ambulatory Visit | Attending: Family Medicine | Admitting: Family Medicine

## 2012-01-18 DIAGNOSIS — R928 Other abnormal and inconclusive findings on diagnostic imaging of breast: Secondary | ICD-10-CM

## 2012-05-29 ENCOUNTER — Ambulatory Visit (INDEPENDENT_AMBULATORY_CARE_PROVIDER_SITE_OTHER): Payer: Self-pay | Admitting: Surgery

## 2012-06-12 ENCOUNTER — Ambulatory Visit (INDEPENDENT_AMBULATORY_CARE_PROVIDER_SITE_OTHER): Payer: Medicare Other | Admitting: Surgery

## 2012-06-12 ENCOUNTER — Encounter (INDEPENDENT_AMBULATORY_CARE_PROVIDER_SITE_OTHER): Payer: Self-pay | Admitting: Surgery

## 2012-06-12 VITALS — BP 170/90 | HR 92 | Temp 98.1°F | Resp 16 | Ht 65.0 in | Wt 160.0 lb

## 2012-06-12 DIAGNOSIS — K649 Unspecified hemorrhoids: Secondary | ICD-10-CM | POA: Insufficient documentation

## 2012-06-12 NOTE — Progress Notes (Signed)
Patient ID: [name], female   DOB: 03/24/1941, 72 y.o.   MRN: [id number]  No chief complaint on file.   HPI [name] is a 72 y.o. female.  Patient sent at the request of Dr.Buccini for hemorrhoids. The patient is a history of over 40 years her symptoms. She has very infrequent symptoms though of feeling unclean and mild burning and itching. Her symptoms are made better with topical ointments. Her symptoms are not frequent. No significant rectal bleeding or pain. No history of constipation. HPI  Past Medical History  Diagnosis Date  . Hypertension   . High cholesterol   . Arthritis   . GERD (gastroesophageal reflux disease)   . Osteoporosis     Past Surgical History  Procedure Date  . Abdominal hysterectomy     Family History  Problem Relation Age of Onset  . Liver cancer    . Diabetes Mother     Social History History  Substance Use Topics  . Smoking status: Never Smoker   . Smokeless tobacco: Not on file  . Alcohol Use: No    Allergies  Allergen Reactions  . Epinephrine Palpitations  . Ivp Dye (Iodinated Diagnostic Agents) Palpitations    Current Outpatient Prescriptions  Medication Sig Dispense Refill  . aspirin 81 MG tablet Take 81 mg by mouth daily.      Marland Kitchen atorvastatin (LIPITOR) 10 MG tablet Take 1 tablet (10 mg total) by mouth every other day.  30 tablet  0  . B Complex-C (SUPER B COMPLEX) TABS Take 1 tablet by mouth daily.      Marland Kitchen bismuth subsalicylate (PEPTO BISMOL) 262 MG chewable tablet Chew 262 mg by mouth once.      . cholecalciferol (VITAMIN D) 1000 UNITS tablet Take 1,000 Units by mouth daily.      . enalapril (VASOTEC) 20 MG tablet Take 20 mg by mouth daily.      . fish oil-omega-3 fatty acids 1000 MG capsule Take 1 g by mouth daily.      . Flaxseed, Linseed, (FLAXSEED OIL) 1000 MG CAPS Take 1,000 mg by mouth daily.      . metoprolol succinate (TOPROL-XL) 25 MG 24 hr tablet Take 25 mg by mouth daily.      . Multiple Vitamin (MULITIVITAMIN WITH  MINERALS) TABS Take 1 tablet by mouth daily.      . ranitidine (ZANTAC 75) 75 MG tablet Take 75 mg by mouth once.        Review of Systems Review of Systems  Constitutional: Negative for fever, chills and unexpected weight change.  HENT: Negative for hearing loss, congestion, sore throat, trouble swallowing and voice change.   Eyes: Negative for visual disturbance.  Respiratory: Negative for cough and wheezing.   Cardiovascular: Negative for chest pain, palpitations and leg swelling.  Gastrointestinal: Negative for nausea, vomiting, abdominal pain, diarrhea, constipation, blood in stool, abdominal distention and anal bleeding.  Genitourinary: Negative for hematuria, vaginal bleeding and difficulty urinating.  Musculoskeletal: Negative for arthralgias.  Skin: Negative for rash and wound.  Neurological: Negative for seizures, syncope and headaches.  Hematological: Negative for adenopathy. Does not bruise/bleed easily.  Psychiatric/Behavioral: Negative for confusion.    Blood pressure 170/90, pulse 92, temperature 98.1 F (36.7 C), resp. rate 16, height 5\' 5"  (1.651 m), weight 160 lb (72.576 kg).  Physical Exam Physical Exam  Constitutional: She appears well-developed and well-nourished.  HENT:  Head: Normocephalic and atraumatic.  Neck: Normal range of motion. Neck supple.  Genitourinary:  Assessment    Anal skin tags Grade 1 internal hemorrhoids    Plan    Recommend medical management this point. Her disease is not extensive to require banding and or injection. She's not operative candidate. Most of her symptoms are from her anal skin tags these can be excised but the risk outweighed the benefits of this. Her symptoms are minimal I recommend medical management this point.       Andreika Vandagriff A. 06/12/2012, 12:12 PM

## 2012-06-12 NOTE — Patient Instructions (Signed)

## 2012-06-22 ENCOUNTER — Other Ambulatory Visit: Payer: Self-pay | Admitting: Family Medicine

## 2012-06-22 DIAGNOSIS — R921 Mammographic calcification found on diagnostic imaging of breast: Secondary | ICD-10-CM

## 2012-08-01 ENCOUNTER — Ambulatory Visit
Admission: RE | Admit: 2012-08-01 | Discharge: 2012-08-01 | Disposition: A | Discharge status: Home / Self Care | Payer: Medicare Other | Source: Ambulatory Visit | Attending: Family Medicine | Admitting: Family Medicine

## 2012-08-01 DIAGNOSIS — R921 Mammographic calcification found on diagnostic imaging of breast: Secondary | ICD-10-CM

## 2012-08-21 IMAGING — US US ABDOMEN COMPLETE
1 series · 14 of 25 positions shown · non-contrast
Comparison: Renal ultrasound 04/21/2010

CLINICAL DATA: Fever, abdominal pain

COMPLETE ABDOMINAL ULTRASOUND

[Series 1: us abdomen complete · 0.28mm/px · 14 of 68 slices shown]
[im 1/68]
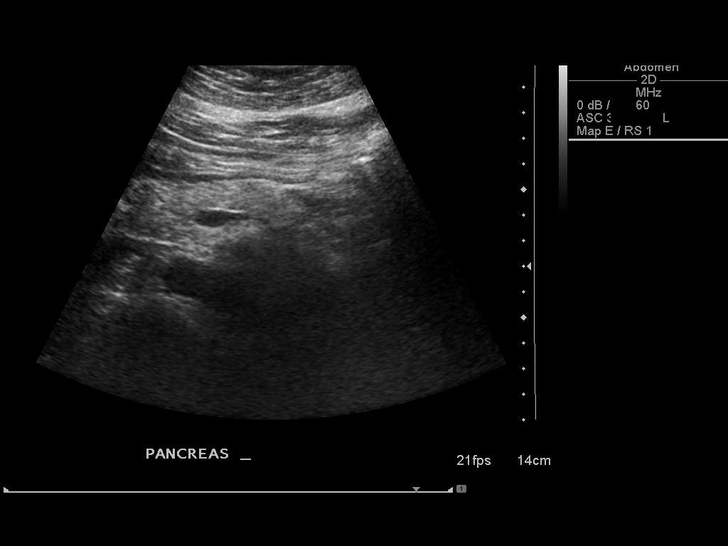
[im 6/68]
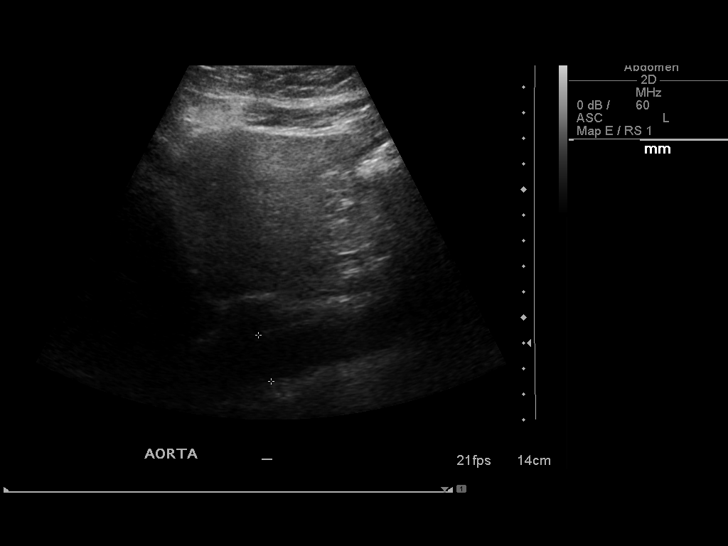
[im 12/68]
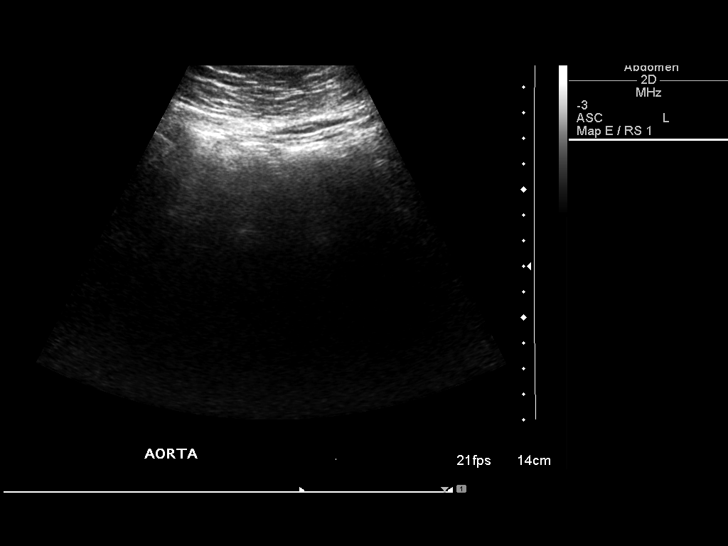
[im 17/68]
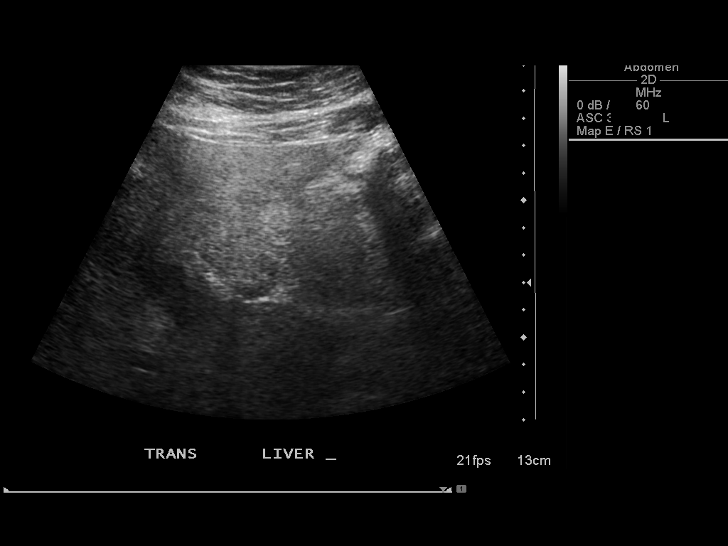
[im 23/68]
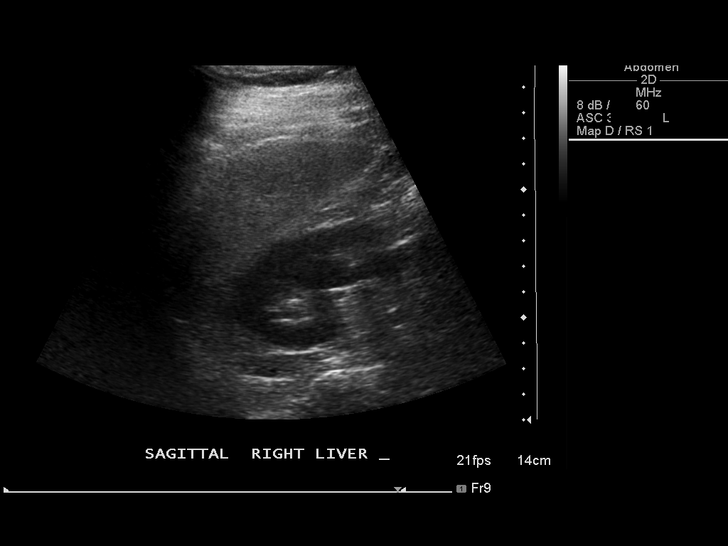
[im 26/68]
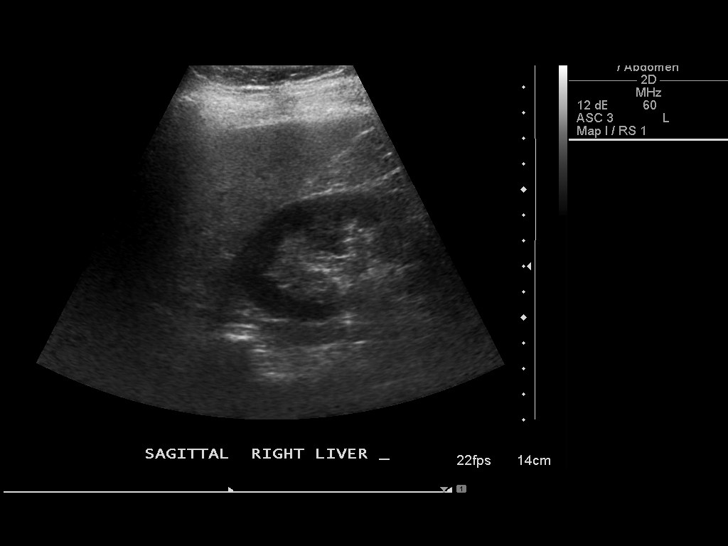
[im 31/68]
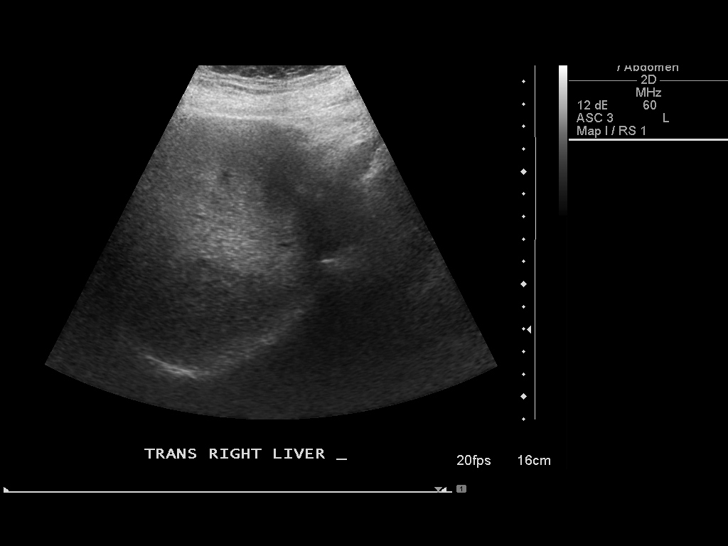
[im 37/68]
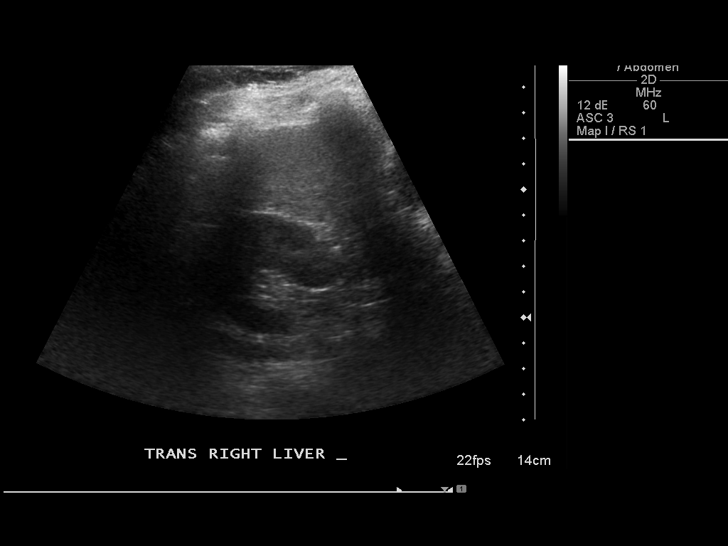
[im 42/68]
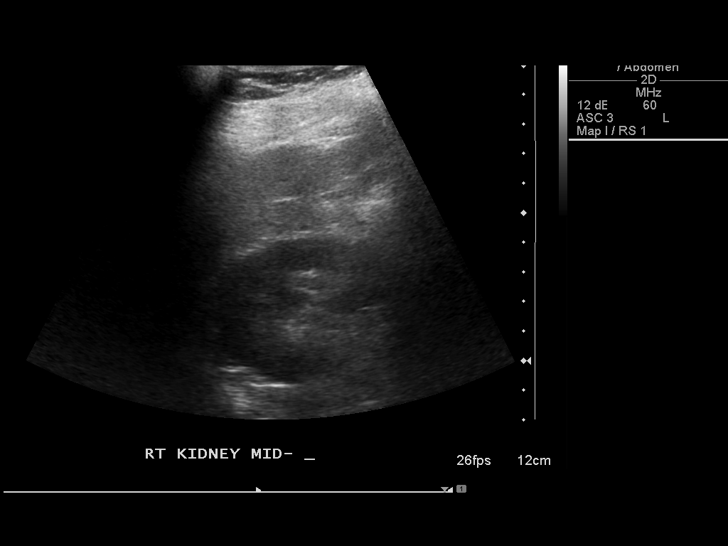
[im 45/68]
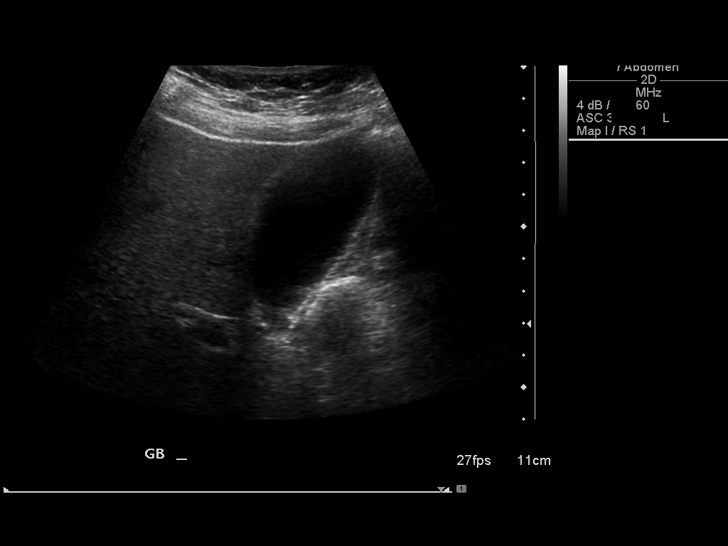
[im 51/68]
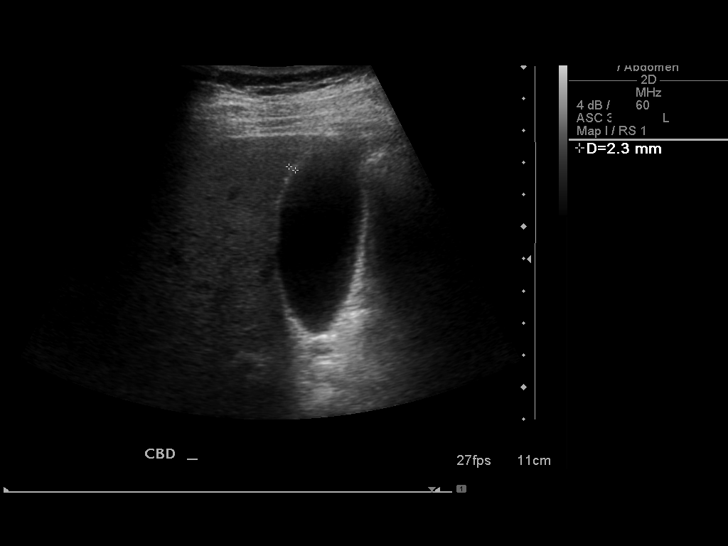
[im 56/68]
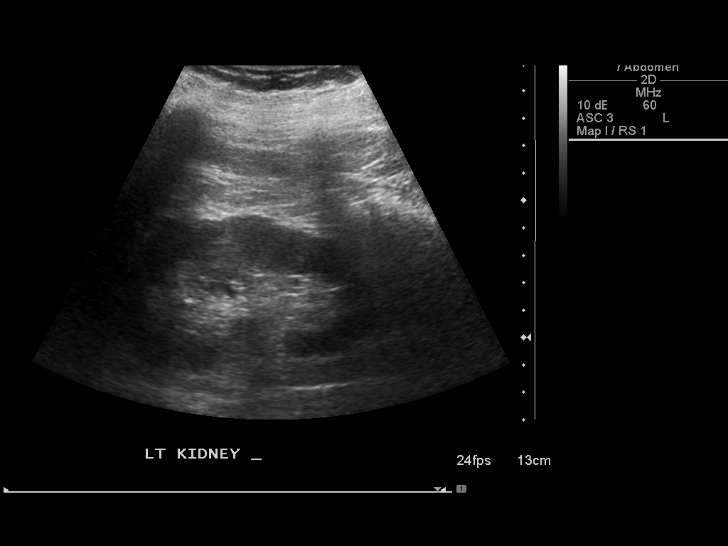
[im 62/68]
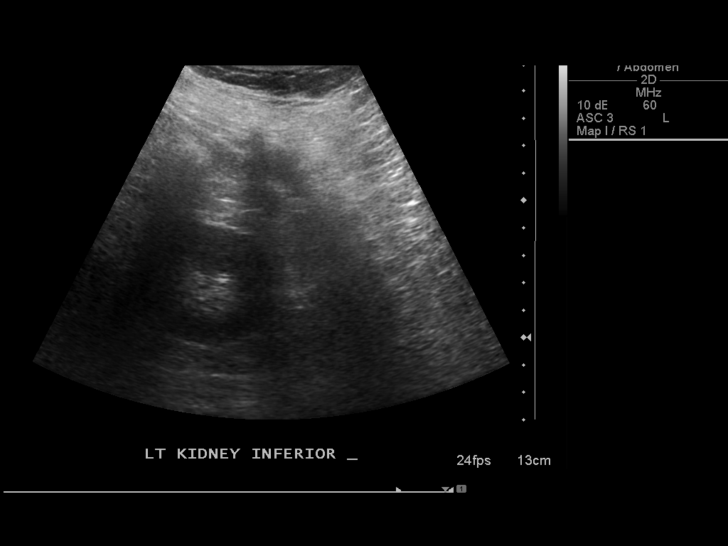
[im 68/68]
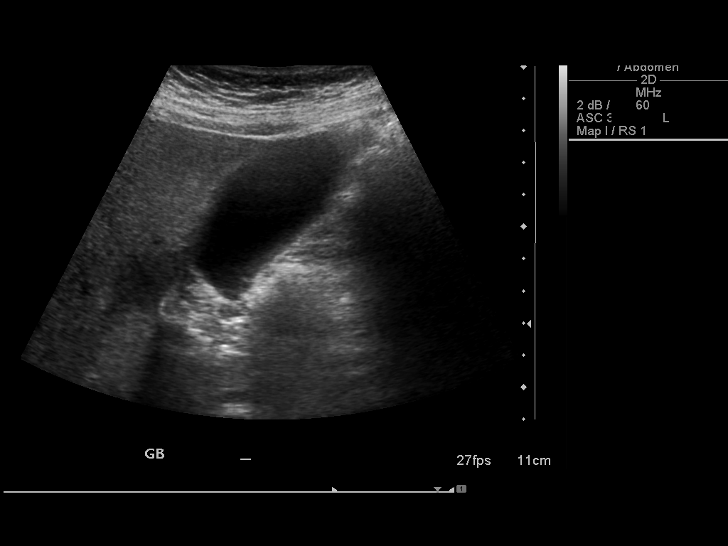

[14 of 25 positions shown; findings below may reference images not displayed]

FINDINGS: Gallbladder:  Small amount of layering gallbladder sludge is noted.
No shadowing gallstones are noted.  No thickening of the
gallbladder wall or sonographic Murphy's sign.

Common bile duct:  Measures 3.6 mm in diameter.

Liver:  No focal lesion identified.  Within normal limits in
parenchymal echogenicity.

IVC:  Appears normal.

Pancreas:  No focal abnormality seen.

Spleen:  Measures 4.6 cm in length.  Normal echogenicity.

Right Kidney:  Measures 10.2 cm in length.  No mass, hydronephrosis
or diagnostic renal calculus

Left Kidney:  Measures 10.4 cm in length.  No mass, hydronephrosis
or diagnostic renal calculus

Abdominal aorta:  No aneurysm identified. Measures up to 1.9 cm in
diameter.
IMPRESSION: 1.  Small amount of gallbladder sludge.  No shadowing gallstones
are noted within gallbladder.
2.  Normal CBD.
3.  No hydronephrosis or diagnostic renal calculus.

## 2013-01-05 ENCOUNTER — Other Ambulatory Visit: Payer: Self-pay | Admitting: Family Medicine

## 2013-01-05 DIAGNOSIS — D241 Benign neoplasm of right breast: Secondary | ICD-10-CM

## 2013-01-31 ENCOUNTER — Ambulatory Visit
Admission: RE | Admit: 2013-01-31 | Discharge: 2013-01-31 | Disposition: A | Discharge status: Home / Self Care | Payer: Medicare Other | Source: Ambulatory Visit | Attending: Family Medicine | Admitting: Family Medicine

## 2013-01-31 DIAGNOSIS — D241 Benign neoplasm of right breast: Secondary | ICD-10-CM

## 2014-01-04 ENCOUNTER — Other Ambulatory Visit: Payer: Self-pay | Admitting: Family Medicine

## 2014-01-04 DIAGNOSIS — N6009 Solitary cyst of unspecified breast: Secondary | ICD-10-CM

## 2014-02-04 ENCOUNTER — Ambulatory Visit
Admission: RE | Admit: 2014-02-04 | Discharge: 2014-02-04 | Disposition: A | Discharge status: Home / Self Care | Payer: Medicare Other | Source: Ambulatory Visit | Attending: Family Medicine | Admitting: Family Medicine

## 2014-02-04 DIAGNOSIS — N6009 Solitary cyst of unspecified breast: Secondary | ICD-10-CM

## 2015-01-09 ENCOUNTER — Other Ambulatory Visit: Payer: Self-pay

## 2015-01-09 DIAGNOSIS — Z1231 Encounter for screening mammogram for malignant neoplasm of breast: Secondary | ICD-10-CM

## 2015-02-06 ENCOUNTER — Ambulatory Visit: Payer: Self-pay

## 2015-02-12 ENCOUNTER — Ambulatory Visit
Admission: RE | Admit: 2015-02-12 | Discharge: 2015-02-12 | Disposition: A | Discharge status: Home / Self Care | Payer: Medicare Other | Source: Ambulatory Visit

## 2015-02-12 DIAGNOSIS — Z1231 Encounter for screening mammogram for malignant neoplasm of breast: Secondary | ICD-10-CM

## 2015-02-14 ENCOUNTER — Other Ambulatory Visit: Payer: Self-pay | Admitting: Family Medicine

## 2015-02-14 DIAGNOSIS — R928 Other abnormal and inconclusive findings on diagnostic imaging of breast: Secondary | ICD-10-CM

## 2015-02-24 ENCOUNTER — Ambulatory Visit
Admission: RE | Admit: 2015-02-24 | Discharge: 2015-02-24 | Disposition: A | Discharge status: Home / Self Care | Payer: Medicare Other | Source: Ambulatory Visit | Attending: Family Medicine | Admitting: Family Medicine

## 2015-02-24 DIAGNOSIS — R928 Other abnormal and inconclusive findings on diagnostic imaging of breast: Secondary | ICD-10-CM

## 2015-05-23 DIAGNOSIS — N9089 Other specified noninflammatory disorders of vulva and perineum: Secondary | ICD-10-CM | POA: Insufficient documentation

## 2015-05-23 DIAGNOSIS — I10 Essential (primary) hypertension: Secondary | ICD-10-CM | POA: Insufficient documentation

## 2015-07-29 ENCOUNTER — Other Ambulatory Visit: Payer: Self-pay | Admitting: Family Medicine

## 2015-07-29 DIAGNOSIS — R921 Mammographic calcification found on diagnostic imaging of breast: Secondary | ICD-10-CM

## 2015-08-26 ENCOUNTER — Other Ambulatory Visit: Payer: Medicare Other

## 2015-09-01 ENCOUNTER — Ambulatory Visit
Admission: RE | Admit: 2015-09-01 | Discharge: 2015-09-01 | Disposition: A | Discharge status: Home / Self Care | Payer: Medicare Other | Source: Ambulatory Visit | Attending: Family Medicine | Admitting: Family Medicine

## 2015-09-01 DIAGNOSIS — R921 Mammographic calcification found on diagnostic imaging of breast: Secondary | ICD-10-CM

## 2015-10-30 ENCOUNTER — Emergency Department (HOSPITAL_COMMUNITY): Payer: Medicare Other

## 2015-10-30 ENCOUNTER — Encounter (HOSPITAL_COMMUNITY): Payer: Self-pay | Admitting: Emergency Medicine

## 2015-10-30 ENCOUNTER — Emergency Department (HOSPITAL_COMMUNITY)
Admission: EM | Admit: 2015-10-30 | Discharge: 2015-10-30 | Disposition: A | Discharge status: Home / Self Care | Payer: Medicare Other | Attending: Emergency Medicine | Admitting: Emergency Medicine

## 2015-10-30 DIAGNOSIS — N200 Calculus of kidney: Secondary | ICD-10-CM | POA: Diagnosis not present

## 2015-10-30 DIAGNOSIS — R319 Hematuria, unspecified: Secondary | ICD-10-CM | POA: Diagnosis present

## 2015-10-30 DIAGNOSIS — M810 Age-related osteoporosis without current pathological fracture: Secondary | ICD-10-CM | POA: Diagnosis not present

## 2015-10-30 DIAGNOSIS — I10 Essential (primary) hypertension: Secondary | ICD-10-CM | POA: Diagnosis not present

## 2015-10-30 DIAGNOSIS — Z7982 Long term (current) use of aspirin: Secondary | ICD-10-CM | POA: Insufficient documentation

## 2015-10-30 DIAGNOSIS — M1990 Unspecified osteoarthritis, unspecified site: Secondary | ICD-10-CM | POA: Diagnosis not present

## 2015-10-30 DIAGNOSIS — K5900 Constipation, unspecified: Secondary | ICD-10-CM | POA: Diagnosis not present

## 2015-10-30 DIAGNOSIS — R1031 Right lower quadrant pain: Secondary | ICD-10-CM

## 2015-10-30 DIAGNOSIS — Z90710 Acquired absence of both cervix and uterus: Secondary | ICD-10-CM | POA: Insufficient documentation

## 2015-10-30 DIAGNOSIS — R112 Nausea with vomiting, unspecified: Secondary | ICD-10-CM

## 2015-10-30 DIAGNOSIS — D72829 Elevated white blood cell count, unspecified: Secondary | ICD-10-CM | POA: Insufficient documentation

## 2015-10-30 DIAGNOSIS — Z79899 Other long term (current) drug therapy: Secondary | ICD-10-CM | POA: Insufficient documentation

## 2015-10-30 LAB — DIFFERENTIAL
Basophils Absolute: 0 10*3/uL (ref 0.0–0.1)
Basophils Relative: 0 %
Eosinophils Absolute: 0 10*3/uL (ref 0.0–0.7)
Eosinophils Relative: 0 %
Lymphocytes Relative: 10 %
Lymphs Abs: 1.1 10*3/uL (ref 0.7–4.0)
Monocytes Absolute: 0.6 10*3/uL (ref 0.1–1.0)
Monocytes Relative: 6 %
Neutro Abs: 8.8 10*3/uL — ABNORMAL HIGH (ref 1.7–7.7)
Neutrophils Relative %: 84 %

## 2015-10-30 LAB — URINALYSIS, ROUTINE W REFLEX MICROSCOPIC
Bilirubin Urine: NEGATIVE
Glucose, UA: NEGATIVE mg/dL
Ketones, ur: NEGATIVE mg/dL
Leukocytes, UA: NEGATIVE
Nitrite: NEGATIVE
Protein, ur: NEGATIVE mg/dL
Specific Gravity, Urine: 1.01 (ref 1.005–1.030)
pH: 6 (ref 5.0–8.0)

## 2015-10-30 LAB — CBC
HCT: 39.8 % (ref 36.0–46.0)
Hemoglobin: 13.3 g/dL (ref 12.0–15.0)
MCH: 29.8 pg (ref 26.0–34.0)
MCHC: 33.4 g/dL (ref 30.0–36.0)
MCV: 89 fL (ref 78.0–100.0)
Platelets: 248 10*3/uL (ref 150–400)
RBC: 4.47 MIL/uL (ref 3.87–5.11)
RDW: 13.2 % (ref 11.5–15.5)
WBC: 10.7 10*3/uL — ABNORMAL HIGH (ref 4.0–10.5)

## 2015-10-30 LAB — COMPREHENSIVE METABOLIC PANEL
ALT: 18 U/L (ref 14–54)
AST: 29 U/L (ref 15–41)
Albumin: 4.3 g/dL (ref 3.5–5.0)
Alkaline Phosphatase: 47 U/L (ref 38–126)
Anion gap: 9 (ref 5–15)
BUN: 24 mg/dL — ABNORMAL HIGH (ref 6–20)
CO2: 26 mmol/L (ref 22–32)
Calcium: 9 mg/dL (ref 8.9–10.3)
Chloride: 106 mmol/L (ref 101–111)
Creatinine, Ser: 1.02 mg/dL — ABNORMAL HIGH (ref 0.44–1.00)
GFR calc Af Amer: >60 mL/min (ref >60)
GFR calc non Af Amer: 54 mL/min — ABNORMAL LOW (ref >60)
Glucose, Bld: 123 mg/dL — ABNORMAL HIGH (ref 65–99)
Potassium: 4.2 mmol/L (ref 3.5–5.1)
Sodium: 141 mmol/L (ref 135–145)
Total Bilirubin: 0.5 mg/dL (ref 0.3–1.2)
Total Protein: 7.1 g/dL (ref 6.5–8.1)

## 2015-10-30 LAB — URINE MICROSCOPIC-ADD ON

## 2015-10-30 LAB — LIPASE, BLOOD: Lipase: 27 U/L (ref 11–51)

## 2015-10-30 MED ORDER — HYDROCODONE-ACETAMINOPHEN 5-325 MG PO TABS: 1.0000 | ORAL_TABLET | Freq: Four times a day (QID) | ORAL | Status: DC | PRN

## 2015-10-30 MED ORDER — ONDANSETRON 4 MG PO TBDP
4.0000 mg | ORAL_TABLET | Freq: Once | ORAL | Status: AC | PRN
  Administered 2015-10-30: 4 mg via ORAL
  Filled 2015-10-30: qty 1

## 2015-10-30 MED ORDER — ONDANSETRON 4 MG PO TBDP: 4.0000 mg | ORAL_TABLET | Freq: Three times a day (TID) | ORAL | Status: DC | PRN

## 2015-10-30 MED ORDER — HYDROCODONE-ACETAMINOPHEN 5-325 MG PO TABS
1.0000 | ORAL_TABLET | Freq: Once | ORAL | Status: AC
  Administered 2015-10-30: 0.5 via ORAL
  Filled 2015-10-30: qty 1

## 2015-10-30 MED ORDER — SODIUM CHLORIDE 0.9 % IV BOLUS (SEPSIS)
1000.0000 mL | Freq: Once | INTRAVENOUS | Status: AC
  Administered 2015-10-30: 1000 mL via INTRAVENOUS

## 2015-10-30 MED ORDER — TAMSULOSIN HCL 0.4 MG PO CAPS
0.4000 mg | ORAL_CAPSULE | Freq: Once | ORAL | Status: AC
  Administered 2015-10-30: 0.4 mg via ORAL
  Filled 2015-10-30: qty 1

## 2015-10-30 MED ORDER — TAMSULOSIN HCL 0.4 MG PO CAPS: 0.4000 mg | ORAL_CAPSULE | Freq: Every day | ORAL | Status: DC

## 2015-10-30 MED ORDER — POLYETHYLENE GLYCOL 3350 17 G PO PACK: 17.0000 g | PACK | Freq: Every day | ORAL | Status: DC

## 2015-10-30 NOTE — ED Provider Notes (Signed)
CSN: [id number]     Arrival date & time 10/30/15  1334 History   First MD Initiated Contact with Patient 10/30/15 1505     Chief Complaint  Patient presents with  . Abdominal Pain     (Consider location/radiation/quality/duration/timing/severity/associated sxs/prior Treatment) HPI Comments: [name] is a 75 y.o. female with a PMHx of HTN, HLD, GERD, osteoporosis, and arthritis, with a PSHx of total abd hysterectomy, who presents to the ED accompanied by her son (who is an ER MD in [place]), with complaints of sudden onset right lower quadrant abdominal pain that began at 10 AM approximately 5 hours prior to arrival. She describes the pain as 7/10 constant waxing and waning aching pain in the right lateral abdomen, radiating somewhat into the rest of the abdomen, with no known aggravating factors, and somewhat relieved with vomiting, with no treatments tried prior to arrival. Associated symptoms include nausea, 3 episodes of nonbloody nonbilious emesis, constipation with her last bowel movement being yesterday, and obstipation with no flatus today (though she states she doesn't often have flatus). She also reports that she had gross hematuria upon arrival here as well as some chills. She states she typically has daily soft stools so not having a bowel movement today is abnormal for her.  She denies any fevers, chest pain, shortness of breath, hematemesis, melena, hematochezia, diarrhea, dysuria, increased urinary frequency, flank pain/back pain, vaginal bleeding or discharge, numbness, tingling, weakness, recent travel, antibiotic use, sick contacts, suspicious food intake, alcohol use, or chronic NSAID use. No history of kidney disease or kidney stones that she knows of. Her PCP is Gerrit Heck, MD at [place]. She does not believe she had her appendix removed when she had her hysterectomy but she's not sure.   Patient is a 75 y.o. female presenting with abdominal pain. The  history is provided by the patient. No language interpreter was used.  Abdominal Pain Pain location:  RLQ Pain quality: aching   Pain radiates to:  RUQ, LLQ and LUQ Pain severity:  Moderate Onset quality:  Sudden Duration:  5 hours Timing:  Constant Progression:  Waxing and waning Chronicity:  New Context: not recent travel, not sick contacts and not suspicious food intake   Relieved by:  Vomiting Worsened by:  Nothing tried Ineffective treatments:  None tried Associated symptoms: chills, constipation, flatus (no flatus today), hematuria, nausea and vomiting   Associated symptoms: no chest pain, no diarrhea, no dysuria, no fever, no hematemesis, no hematochezia, no melena, no shortness of breath, no vaginal bleeding and no vaginal discharge   Risk factors: no alcohol abuse, has not had multiple surgeries and no NSAID use     Past Medical History  Diagnosis Date  . Hypertension   . High cholesterol   . Arthritis   . GERD (gastroesophageal reflux disease)   . Osteoporosis    Past Surgical History  Procedure Laterality Date  . Abdominal hysterectomy     Family History  Problem Relation Age of Onset  . Liver cancer    . Diabetes Mother    Social History  Substance Use Topics  . Smoking status: Never Smoker   . Smokeless tobacco: None  . Alcohol Use: No   OB History    No data available     Review of Systems  Constitutional: Positive for chills. Negative for fever.  Respiratory: Negative for shortness of breath.   Cardiovascular: Negative for chest pain.  Gastrointestinal: Positive for nausea, vomiting, abdominal pain, constipation and flatus (  no flatus today). Negative for diarrhea, blood in stool, melena, hematochezia, anal bleeding and hematemesis.  Genitourinary: Positive for hematuria. Negative for dysuria, frequency, flank pain, vaginal bleeding and vaginal discharge.  Musculoskeletal: Negative for myalgias, back pain and arthralgias.  Skin: Negative for color  change.  Allergic/Immunologic: Negative for immunocompromised state.  Neurological: Negative for weakness and numbness.  Psychiatric/Behavioral: Negative for confusion.   10 Systems reviewed and are negative for acute change except as noted in the HPI.    Allergies  Epinephrine and Ivp dye  Home Medications   Prior to Admission medications   Medication Sig Start Date End Date Taking? Authorizing Provider  aspirin 81 MG tablet Take 81 mg by mouth daily.    Historical Provider, MD  atorvastatin (LIPITOR) 10 MG tablet Take 1 tablet (10 mg total) by mouth every other day. 08/17/11   Minus Breeding, MD  B Complex-C (SUPER B COMPLEX) TABS Take 1 tablet by mouth daily.    Historical Provider, MD  bismuth subsalicylate (PEPTO BISMOL) 262 MG chewable tablet Chew 262 mg by mouth once.    Historical Provider, MD  cholecalciferol (VITAMIN D) 1000 UNITS tablet Take 1,000 Units by mouth daily.    Historical Provider, MD  enalapril (VASOTEC) 20 MG tablet Take 20 mg by mouth daily.    Historical Provider, MD  fish oil-omega-3 fatty acids 1000 MG capsule Take 1 g by mouth daily.    Historical Provider, MD  Flaxseed, Linseed, (FLAXSEED OIL) 1000 MG CAPS Take 1,000 mg by mouth daily.    Historical Provider, MD  metoprolol succinate (TOPROL-XL) 25 MG 24 hr tablet Take 25 mg by mouth daily.    Historical Provider, MD  Multiple Vitamin (MULITIVITAMIN WITH MINERALS) TABS Take 1 tablet by mouth daily.    Historical Provider, MD  ranitidine (ZANTAC 75) 75 MG tablet Take 75 mg by mouth once.    Historical Provider, MD   BP 189/84 mmHg  Pulse 74  Temp(Src) 98.6 F (37 C) (Oral)  Resp 18  Ht 5\' 5"  (1.651 m)  Wt 71.215 kg  BMI 26.13 kg/m2  SpO2 98% Physical Exam  Constitutional: She is oriented to person, place, and time. Vital signs are normal. She appears well-developed and well-nourished.  Non-toxic appearance. No distress.  Afebrile, nontoxic, NAD, mildly elevated BP similar to prior visits  HENT:   Head: Normocephalic and atraumatic.  Mouth/Throat: Oropharynx is clear and moist and mucous membranes are normal.  Eyes: Conjunctivae and EOM are normal. Right eye exhibits no discharge. Left eye exhibits no discharge.  Neck: Normal range of motion. Neck supple.  Cardiovascular: Normal rate, regular rhythm, normal heart sounds and intact distal pulses.  Exam reveals no gallop and no friction rub.   No murmur heard. Pulmonary/Chest: Effort normal and breath sounds normal. No respiratory distress. She has no decreased breath sounds. She has no wheezes. She has no rhonchi. She has no rales.  Abdominal: Soft. Normal appearance and bowel sounds are normal. She exhibits no distension. There is generalized tenderness. There is no rigidity, no rebound, no guarding, no CVA tenderness, no tenderness at McBurney's point and negative Murphy's sign.  Soft, nondistended, +BS throughout, with minimal TTP diffusely throughout abdomen without focal area of tenderness, no r/g/r, neg murphy's, neg mcburney's, no CVA TTP   Musculoskeletal: Normal range of motion.  Neurological: She is alert and oriented to person, place, and time. She has normal strength. No sensory deficit.  Skin: Skin is warm, dry and intact. No rash noted.  Psychiatric: She has a normal mood and affect.  Nursing note and vitals reviewed.   ED Course  Procedures (including critical care time) Labs Review Labs Reviewed  COMPREHENSIVE METABOLIC PANEL - Abnormal; Notable for the following:    Glucose, Bld 123 (*)    BUN 24 (*)    Creatinine, Ser 1.02 (*)    GFR calc non Af Amer 54 (*)    All other components within normal limits  CBC - Abnormal; Notable for the following:    WBC 10.7 (*)    All other components within normal limits  URINALYSIS, ROUTINE W REFLEX MICROSCOPIC (NOT AT [facility]) - Abnormal; Notable for the following:    Hgb urine dipstick LARGE (*)    All other components within normal limits  URINE MICROSCOPIC-ADD ON -  Abnormal; Notable for the following:    Squamous Epithelial / LPF 0-5 (*)    Bacteria, UA RARE (*)    All other components within normal limits  DIFFERENTIAL - Abnormal; Notable for the following:    Neutro Abs 8.8 (*)    All other components within normal limits  URINE CULTURE  LIPASE, BLOOD    Imaging Review Ct Renal Stone Study  10/30/2015  CLINICAL DATA:  Right flank pain for 1 day.  Hematuria. EXAM: CT ABDOMEN AND PELVIS WITHOUT CONTRAST TECHNIQUE: Multidetector CT imaging of the abdomen and pelvis was performed following the standard protocol without oral or intravenous contrast material administration. COMPARISON:  None. FINDINGS: Lower chest: Lung bases are clear. There are foci of coronary artery calcification. Hepatobiliary: No focal liver lesions are evident on this noncontrast enhanced study. The gallbladder wall is not appreciably thickened. There is no biliary duct dilatation. Pancreas: There is no pancreatic mass or inflammatory focus. Spleen: No splenic lesions are evident. Adrenals/Urinary Tract: Adrenals appear normal bilaterally. There is no renal mass on either side. There is diffuse edema involving the right kidney with perinephric stranding on the right. There is no hydronephrosis on the left. On the right, there is mild to moderate hydronephrosis. There is no intrarenal calculus on either side. On the left, is a 1 mm calculus in the upper anterior at the mid sacral level, seen on axial slice 73 series 2. No other ureteral calculus is seen on the left. On the right, there is a calculus just beyond the ureteropelvic junction measuring 5 x 3 mm. There is edema in the region of the proximal right ureter. A second ureteral calculus measuring 2 mm is seen at the level of L4-5 and proximal right ureter, seen on axial slice 50 series 2. No other ureteral calculi are identified on the right. There are phleboliths in the pelvis as well. The urinary bladder is midline with wall thickness  within normal limits. Stomach/Bowel: There is no bowel wall or mesenteric thickening. There is no bowel obstruction. No free air or portal venous air. Vascular/Lymphatic: There are scattered foci of atherosclerotic calcification of the aorta and right common iliac artery. There is no abdominal aortic aneurysm. There is no evidence of major mesenteric vessel obstruction on this noncontrast enhanced study. There is no adenopathy in the abdomen or pelvis. Reproductive: Uterus is absent. There is no pelvic mass or pelvic fluid collection. Other: Appendix appears normal. There is no abscess or ascites in the abdomen or pelvis. Musculoskeletal: There is degenerative change in the lumbar spine with vacuum phenomenon noted at L5-S1. There are no blastic or lytic bone lesions. There is no intramuscular or abdominal wall lesion. IMPRESSION:  There is right-sided hydronephrosis and right renal edema. There is a 5 x 3 mm calculus just beyond the right ureteropelvic junction. A 2 mm second calculus is noted at the level of L4-5 on the right. On the left, there is no appreciable hydronephrosis. However, a 1 mm calculus is seen in the left ureter at the mid sacral level. No bowel obstruction.  No abscess.  Appendix appears normal. Electronically Signed   By: Lowella Grip III M.D.   On: 10/30/2015 16:11   I have personally reviewed and evaluated these images and lab results as part of my medical decision-making.   EKG Interpretation None      MDM   Final diagnoses:  RLQ abdominal pain  Non-intractable vomiting with nausea, vomiting of unspecified type  Constipation, unspecified constipation type  Leukocytosis  Hematuria  Essential hypertension  Nephrolithiasis    75 y.o. female here with RLQ abd pain, n/v, constipation/obstipation, and hematuria x5hrs. On exam, diffuse abdominal tenderness without focal area of pain, nonperitoneal, no tenderness at mcburney's point, neg murphy's. Adequate bowel sounds  throughout. Given zofran in triage which helped. She doesn't want anything for pain currently. Labs reveal mild leukocytosis, differential not done, will add on; lipase WNL, CMP showing mildly bumped BUN/Cr 24/1.02 likely from slight dehydration from vomiting. U/A without nitrites or leuks, rare bacteria, 0-5 squamous, 0-5 WBC, and TNTC RBCs, no evidence of UTI but given age will send for UCx. DDx includings kidney stones vs obstruction vs appendicitis vs other etiologies. Pt unable to tolerate CT contrast, will opt for CT w/o contrast and hopefully be able to visualize everything we need to see. Will give fluids and reassess shortly. Discussed case with my attending Dr. Jeneen Rinks who agrees with plan.   4:19 PM CT showing R kidney stones one 5x35mm and another 22mm one further down, mild hydronephrosis. Likely will pass. Pt will be given flomax and vicodin here to ensure she can tolerate these going home, and to PO challenge; she states she doesn't want to take the vicodin right now because she's not in severe pain, discussed importance of staying out of pain so the n/v doesn't return-- she's getting fluids now so she will decide shortly if she wants to take the vicodin here. Differential returning, slightly neutrophilic predominance but overall reassuring. CT mentions that the appendix is normal. Moderate stool burden, no obstruction. Will send home with urine strainer, flomax, zofran, norco, and miralax. Discussed additional ibuprofen as well. Stay hydrated, increase fiber/water intake. F/up with urology in 1-2wks. Will let fluids run and see how she does with the PO challenge, then reassess shortly.   5:17 PM IV Fluids nearly finished. Tolerating PO well. Ended up taking 1/2 vicodin, doing well, pain improved, ready to go home. Will d/c home with previously outlined plan. I explained the diagnosis and have given explicit precautions to return to the ER including for any other new or worsening symptoms. The  patient understands and accepts the medical plan as it's been dictated and I have answered their questions. Discharge instructions concerning home care and prescriptions have been given. The patient is STABLE and is discharged to home in good condition.  BP 188/80 mmHg  Pulse 87  Temp(Src) 98.6 F (37 C) (Oral)  Resp 19  Ht 5\' 5"  (1.651 m)  Wt 71.215 kg  BMI 26.13 kg/m2  SpO2 98%  Meds ordered this encounter  Medications  . ondansetron (ZOFRAN-ODT) disintegrating tablet 4 mg    Sig:   . sodium  chloride 0.9 % bolus 1,000 mL    Sig:   . tamsulosin (FLOMAX) capsule 0.4 mg    Sig:   . HYDROcodone-acetaminophen (NORCO/VICODIN) 5-325 MG per tablet 1 tablet    Sig:   . tamsulosin (FLOMAX) 0.4 MG CAPS capsule    Sig: Take 1 capsule (0.4 mg total) by mouth daily after supper. Take until you have passed the kidney stones    Dispense:  15 capsule    Refill:  0    Order Specific Question:  Supervising Provider    Answer:  MILLER, [place]  . HYDROcodone-acetaminophen (NORCO) 5-325 MG tablet    Sig: Take 1-2 tablets by mouth every 6 (six) hours as needed for severe pain.    Dispense:  15 tablet    Refill:  0    Order Specific Question:  Supervising Provider    Answer:  MILLER, BRIAN [[id number]]  . polyethylene glycol (MIRALAX / GLYCOLAX) packet    Sig: Take 17 g by mouth daily.    Dispense:  14 each    Refill:  0    Order Specific Question:  Supervising Provider    Answer:  Sabra Heck, BRIAN [[id number]]  . ondansetron (ZOFRAN ODT) 4 MG disintegrating tablet    Sig: Take 1 tablet (4 mg total) by mouth every 8 (eight) hours as needed for nausea or vomiting.    Dispense:  15 tablet    Refill:  0    Order Specific Question:  Supervising Provider    Answer:  Noemi Chapel [[id number]]      Kaityln Kallstrom Camprubi-Soms, PA-C 10/30/15 1718  Tanna Furry, MD 11/08/15 1345

## 2015-10-30 NOTE — ED Notes (Signed)
Patient c/o right flank pain and RLQ pain since yesterday.  Patient also states she has been vomiting and unable to have a BM since yesterday.  This is not new for the patient.  Patient states she vomited up her anti-hypertensive medication earlier today.  Patient denies dysuria or blood in urine.

## 2015-10-30 NOTE — ED Provider Notes (Signed)
Pt seen and evaluated. Describes pain in her left lower abdomen. Her this morning. Rather indolent at first. Had a sudden severe worsening of pain with multiple episodes of emesis. States her pain has improved. Started as a 1 or 2. Became of 10 over 10, is now a 7/10. Had some Zofran and no additional nausea or vomiting now. No previous stones. Exam is benign. Does not have reproducible tenderness. Has gross hematuria. CT shows approximate 4-5 mm proximal ureteral stones. Mild Hydro. Urine is uninfected. She tolerates symptoms well. Plan will be outpatient follow-up, pain control, fluids.  Tanna Furry, MD 10/30/15 [phone]

## 2015-10-30 NOTE — ED Notes (Signed)
Patient states she has right lower abdominal pain. Reports vomiting twice. Denies diarrhea. Last bowel movement was yesterday morning. Patient was unable to have a bowel movement today.

## 2015-10-30 NOTE — Discharge Instructions (Signed)
Take ibuprofen as directed as needed for pain using norco for breakthrough pain. Do not drive or operate machinery with pain medication use. Take miralax as directed until you achieve daily soft bowel movements, and then continue as needed to continue having daily soft stools. Use Zofran as needed for nausea. Stay well hydrated with plenty of water. Increase the fiber in your diet to help with constipation. Use Flomax as directed, as this medication will help you pass the stone. Strain all urine to try to catch the stone when it passes. Follow-up with the urologist in the next 1 to 2 weeks for recheck of ongoing pain, however for intractable or uncontrollable pain at home then return to the [facility] emergency department.     Kidney Stones Kidney stones (urolithiasis) are solid masses that form inside your kidneys. The intense pain is caused by the stone moving through the kidney, ureter, bladder, and urethra (urinary tract). When the stone moves, the ureter starts to spasm around the stone. The stone is usually passed in your pee (urine).  HOME CARE  Drink enough fluids to keep your pee clear or pale yellow. This helps to get the stone out.  Take a 24-hour pee (urine) sample as told by your doctor. You may need to take another sample every 6-12 months.  Strain all pee through the provided strainer. Do not pee without peeing through the strainer, not even once. If you pee the stone out, catch it in the strainer. The stone may be as small as a grain of salt. Take this to your doctor. This will help your doctor figure out what you can do to try to prevent more kidney stones.  Only take medicine as told by your doctor.  Make changes to your daily diet as told by your doctor. You may be told to:  Limit how much salt you eat.  Eat 5 or more servings of fruits and vegetables each day.  Limit how much meat, poultry, fish, and eggs you eat.  Keep all follow-up visits as told by your doctor. This is  important.  Get follow-up X-rays as told by your doctor. GET HELP IF: You have pain that gets worse even if you have been taking pain medicine. GET HELP RIGHT AWAY IF:   Your pain does not get better with medicine.  You have a fever or shaking chills.  Your pain increases and gets worse over 18 hours.  You have new belly (abdominal) pain.  You feel faint or pass out.  You are unable to pee.   This information is not intended to replace advice given to you by your health care provider. Make sure you discuss any questions you have with your health care provider.   Document Released: 10/20/2007 Document Revised: 01/22/2015 Document Reviewed: 10/04/2012 Elsevier Interactive Patient Education 2016 [place] Guidelines to Help Prevent Kidney Stones Your risk of kidney stones can be decreased by adjusting the foods you eat. The most important thing you can do is drink enough fluid. You should drink enough fluid to keep your urine clear or pale yellow. The following guidelines provide specific information for the type of kidney stone you have had. GUIDELINES ACCORDING TO TYPE OF KIDNEY STONE Calcium Oxalate Kidney Stones  Reduce the amount of salt you eat. Foods that have a lot of salt cause your body to release excess calcium into your urine. The excess calcium can combine with a substance called oxalate to form kidney stones.  Reduce  the amount of animal protein you eat if the amount you eat is excessive. Animal protein causes your body to release excess calcium into your urine. Ask your dietitian how much protein from animal sources you should be eating.  Avoid foods that are high in oxalates. If you take vitamins, they should have less than 500 mg of vitamin C. Your body turns vitamin C into oxalates. You do not need to avoid fruits and vegetables high in vitamin C. Calcium Phosphate Kidney Stones  Reduce the amount of salt you eat to help prevent the release of excess  calcium into your urine.  Reduce the amount of animal protein you eat if the amount you eat is excessive. Animal protein causes your body to release excess calcium into your urine. Ask your dietitian how much protein from animal sources you should be eating.  Get enough calcium from food or take a calcium supplement (ask your dietitian for recommendations). Food sources of calcium that do not increase your risk of kidney stones include:  Broccoli.  Dairy products, such as cheese and yogurt.  Pudding. Uric Acid Kidney Stones  Do not have more than 6 oz of animal protein per day. FOOD SOURCES Animal Protein Sources  Meat (all types).  Poultry.  Eggs.  Fish, seafood. Foods High in [employer] seasonings.  Soy sauce.  Teriyaki sauce.  Cured and processed meats.  Salted crackers and snack foods.  Fast food.  Canned soups and most canned foods. Foods High in Oxalates  Grains:  Amaranth.  Barley.  Grits.  Wheat germ.  Bran.  Buckwheat flour.  All bran cereals.  Pretzels.  Whole wheat bread.  Vegetables:  Beans (wax).  Beets and beet greens.  Collard greens.  Eggplant.  [place].  Leeks.  Okra.  Parsley.  [place].  Spinach.  Swiss chard.  Tomato paste.  Fried potatoes.  Sweet potatoes.  Fruits:  Red currants.  Figs.  Kiwi.  Rhubarb.  Meat and Other Protein Sources:  Beans (dried).  Soy burgers and other soybean products.  Miso.  Nuts (peanuts, almonds, pecans, cashews, hazelnuts).  Nut butters.  Sesame seeds and tahini (paste made of sesame seeds).  Poppy seeds.  Beverages:  Chocolate drink mixes.  Soy milk.  Instant iced tea.  Juices made from high-oxalate fruits or vegetables.  Other:  Carob.  Chocolate.  Fruitcake.  Marmalades.   This information is not intended to replace advice given to you by your health care provider. Make sure you discuss any questions you have with your health  care provider.   Document Released: 08/28/2010 Document Revised: 05/08/2013 Document Reviewed: 03/30/2013 Elsevier Interactive Patient Education 2016 [employer].  Constipation, Adult Constipation is when a person:  Poops (has a bowel movement) less than 3 times a week.  Has a hard time pooping.  Has poop that is dry, hard, or bigger than normal. HOME CARE   Eat foods with a lot of fiber in them. This includes fruits, vegetables, beans, and whole grains such as brown rice.  Avoid fatty foods and foods with a lot of sugar. This includes french fries, hamburgers, cookies, candy, and soda.  If you are not getting enough fiber from food, take products with added fiber in them (supplements).  Drink enough fluid to keep your pee (urine) clear or pale yellow.  Exercise on a regular basis, or as told by your doctor.  Go to the restroom when you feel like you need to poop. Do not hold it.  Only take medicine as told by your doctor. Do not take medicines that help you poop (laxatives) without talking to your doctor first. GET HELP RIGHT AWAY IF:   You have bright red blood in your poop (stool).  Your constipation lasts more than 4 days or gets worse.  You have belly (abdominal) or butt (rectal) pain.  You have thin poop (as thin as a pencil).  You lose weight, and it cannot be explained. MAKE SURE YOU:   Understand these instructions.  Will watch your condition.  Will get help right away if you are not doing well or get worse.   This information is not intended to replace advice given to you by your health care provider. Make sure you discuss any questions you have with your health care provider.   Document Released: 10/20/2007 Document Revised: 05/24/2014 Document Reviewed: 02/12/2013 Elsevier Interactive Patient Education 2016 Elsevier Inc.  High-Fiber Diet Fiber, also called dietary fiber, is a type of carbohydrate found in fruits, vegetables, whole grains, and beans.  A high-fiber diet can have many health benefits. Your health care provider may recommend a high-fiber diet to help:  Prevent constipation. Fiber can make your bowel movements more regular.  Lower your cholesterol.  Relieve hemorrhoids, uncomplicated diverticulosis, or irritable bowel syndrome.  Prevent overeating as part of a weight-loss plan.  Prevent heart disease, type 2 diabetes, and certain cancers. WHAT IS MY PLAN? The recommended daily intake of fiber includes:  38 grams for men under age 57.  54 grams for men over age 100.  79 grams for women under age 15.  58 grams for women over age 66. You can get the recommended daily intake of dietary fiber by eating a variety of fruits, vegetables, grains, and beans. Your health care provider may also recommend a fiber supplement if it is not possible to get enough fiber through your diet. WHAT DO I NEED TO KNOW ABOUT A HIGH-FIBER DIET?  Fiber supplements have not been widely studied for their effectiveness, so it is better to get fiber through food sources.  Always check the fiber content on thenutrition facts label of any prepackaged food. Look for foods that contain at least 5 grams of fiber per serving.  Ask your dietitian if you have questions about specific foods that are related to your condition, especially if those foods are not listed in the following section.  Increase your daily fiber consumption gradually. Increasing your intake of dietary fiber too quickly may cause bloating, cramping, or gas.  Drink plenty of water. Water helps you to digest fiber. WHAT FOODS CAN I EAT? Grains Whole-grain breads. Multigrain cereal. Oats and oatmeal. Brown rice. Barley. Bulgur wheat. [place]. Bran muffins. Popcorn. Rye wafer crackers. Vegetables Sweet potatoes. Spinach. Kale. Artichokes. Cabbage. Broccoli. Green peas. Carrots. Squash. Fruits Berries. Pears. Apples. Oranges. Avocados. Prunes and raisins. Dried figs. Meats and Other  Protein Sources [place], kidney, pinto, and soy beans. Split peas. Lentils. Nuts and seeds. Dairy Fiber-fortified yogurt. Beverages Fiber-fortified soy milk. Fiber-fortified orange juice. Other Fiber bars. The items listed above may not be a complete list of recommended foods or beverages. Contact your dietitian for more options. WHAT FOODS ARE NOT RECOMMENDED? Grains White bread. Pasta made with refined flour. White rice. Vegetables Fried potatoes. Canned vegetables. Well-cooked vegetables.  Fruits Fruit juice. Cooked, strained fruit. Meats and Other Protein Sources Fatty cuts of meat. Fried Sales executive or fried fish. Dairy Milk. Yogurt. Cream cheese. Sour cream. Beverages Soft drinks. Other Cakes and pastries. Butter  and oils. The items listed above may not be a complete list of foods and beverages to avoid. Contact your dietitian for more information. WHAT ARE SOME TIPS FOR INCLUDING HIGH-FIBER FOODS IN MY DIET?  Eat a wide variety of high-fiber foods.  Make sure that half of all grains consumed each day are whole grains.  Replace breads and cereals made from refined flour or white flour with whole-grain breads and cereals.  Replace white rice with brown rice, bulgur wheat, or millet.  Start the day with a breakfast that is high in fiber, such as a cereal that contains at least 5 grams of fiber per serving.  Use beans in place of meat in soups, salads, or pasta.  Eat high-fiber snacks, such as berries, raw vegetables, nuts, or popcorn.   This information is not intended to replace advice given to you by your health care provider. Make sure you discuss any questions you have with your health care provider.   Document Released: 05/03/2005 Document Revised: 05/24/2014 Document Reviewed: 10/16/2013 Elsevier Interactive Patient Education 2016 Elsevier Inc.  Nausea and Vomiting Nausea means you feel sick to your stomach. Throwing up (vomiting) is a reflex where stomach contents  come out of your mouth. HOME CARE   Take medicine as told by your doctor.  Do not force yourself to eat. However, you do need to drink fluids.  If you feel like eating, eat a normal diet as told by your doctor.  Eat rice, wheat, potatoes, bread, lean meats, yogurt, fruits, and vegetables.  Avoid high-fat foods.  Drink enough fluids to keep your pee (urine) clear or pale yellow.  Ask your doctor how to replace body fluid losses (rehydrate). Signs of body fluid loss (dehydration) include:  Feeling very thirsty.  Dry lips and mouth.  Feeling dizzy.  Dark pee.  Peeing less than normal.  Feeling confused.  Fast breathing or heart rate. GET HELP RIGHT AWAY IF:   You have blood in your throw up.  You have black or bloody poop (stool).  You have a bad headache or stiff neck.  You feel confused.  You have bad belly (abdominal) pain.  You have chest pain or trouble breathing.  You do not pee at least once every 8 hours.  You have cold, clammy skin.  You keep throwing up after 24 to 48 hours.  You have a fever. MAKE SURE YOU:   Understand these instructions.  Will watch your condition.  Will get help right away if you are not doing well or get worse.   This information is not intended to replace advice given to you by your health care provider. Make sure you discuss any questions you have with your health care provider.   Document Released: 10/20/2007 Document Revised: 07/26/2011 Document Reviewed: 10/02/2010 Elsevier Interactive Patient Education [employer].

## 2015-11-01 LAB — URINE CULTURE: Culture: NO GROWTH

## 2016-01-28 ENCOUNTER — Other Ambulatory Visit: Payer: Self-pay | Admitting: Family Medicine

## 2016-01-28 DIAGNOSIS — R921 Mammographic calcification found on diagnostic imaging of breast: Secondary | ICD-10-CM

## 2016-03-24 ENCOUNTER — Ambulatory Visit
Admission: RE | Admit: 2016-03-24 | Discharge: 2016-03-24 | Disposition: A | Discharge status: Home / Self Care | Payer: Medicare Other | Source: Ambulatory Visit | Attending: Family Medicine | Admitting: Family Medicine

## 2016-03-24 DIAGNOSIS — R921 Mammographic calcification found on diagnostic imaging of breast: Secondary | ICD-10-CM

## 2016-04-01 ENCOUNTER — Encounter: Payer: Self-pay | Admitting: Cardiology

## 2016-04-22 ENCOUNTER — Encounter: Payer: Self-pay | Admitting: Cardiology

## 2016-04-26 ENCOUNTER — Ambulatory Visit: Payer: Medicare Other | Admitting: Cardiology

## 2016-04-26 NOTE — Progress Notes (Signed)
Cardiology Office Note   Date:  04/28/2016   ID:  Janice, [name] [birth year], MRN [id number]  PCP:  Janice Heck, MD  Cardiologist:   Janice Breeding, MD  Referring:  Janice Heck, MD  Chief Complaint  Patient presents with  . Coronary Artery Disease     History of Present Illness: [name] is a 75 y.o. female who presents for follow up of CAD.  I last saw her in 2011.   I had seen her prior to this in 2003.  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  She returns for follow up.  She has been doing well.  She walks every single day rain, sleet, snow or sun.  She eats sparingly.  The patient denies any new symptoms such as chest discomfort, neck or arm discomfort. There has been no new shortness of breath, PND or orthopnea. There have been no reported palpitations, presyncope or syncope.  Past Medical History:  Diagnosis Date  . Arthritis   . GERD (gastroesophageal reflux disease)   . High cholesterol   . Hypertension   . Osteoporosis     Past Surgical History:  Procedure Laterality Date  . ABDOMINAL HYSTERECTOMY       Current Outpatient Prescriptions  Medication Sig Dispense Refill  . aspirin 81 MG tablet Take 81 mg by mouth daily.    . B Complex-C (SUPER B COMPLEX) TABS Take 1 tablet by mouth daily.    . cholecalciferol (VITAMIN D) 1000 UNITS tablet Take 1,000 Units by mouth daily.    . enalapril (VASOTEC) 10 MG tablet Take 20 mg by mouth daily.  1  . fish oil-omega-3 fatty acids 1000 MG capsule Take 1 g by mouth daily.    . Flaxseed, Linseed, (FLAXSEED OIL) 1000 MG CAPS Take 1,000 mg by mouth daily.    . metoprolol succinate (TOPROL-XL) 25 MG 24 hr tablet Take 50 mg by mouth daily.     . Multiple Vitamin (MULITIVITAMIN WITH MINERALS) TABS Take 1 tablet by mouth daily.     No current facility-administered medications for this visit.     Allergies:   Other; Epinephrine; and Ivp dye [iodinated diagnostic  agents]    Social History:  The patient  reports that she has never smoked. She has never used smokeless tobacco. She reports that she does not drink alcohol or use drugs.   Family History:  The patient's family history includes Diabetes in her mother.    ROS:  Please see the history of present illness.   Otherwise, review of systems are positive for none.   All other systems are reviewed and negative.    PHYSICAL EXAM: VS:  BP (!) 164/90   Pulse 75   Ht 5\' 5"  (1.651 m)   Wt 156 lb 9.6 oz (71 kg)   BMI 26.06 kg/m  , BMI Body mass index is 26.06 kg/m. GENERAL:  Well appearing HEENT:  Pupils equal round and reactive, fundi not visualized, oral mucosa unremarkable NECK:  No jugular venous distention, waveform within normal limits, carotid upstroke brisk and symmetric, possible soft left bruits, no thyromegaly LYMPHATICS:  No cervical, inguinal adenopathy LUNGS:  Clear to auscultation bilaterally BACK:  No CVA tenderness CHEST:  Unremarkable HEART:  PMI not displaced or sustained,S1 and S2 within normal limits, no S3, no S4, no clicks, no rubs, no murmurs ABD:  Flat, positive bowel sounds normal in frequency in pitch, no bruits, no rebound, no guarding, no  midline pulsatile mass, no hepatomegaly, no splenomegaly EXT:  2 plus pulses throughout, no edema, no cyanosis no clubbing SKIN:  No rashes no nodules NEURO:  Cranial nerves II through XII grossly intact, motor grossly intact throughout PSYCH:  Cognitively intact, oriented to person place and time    EKG:  EKG is ordered today. The ekg ordered today demonstrates normal sinus rhythm, rate 75, axis within normal limits, intervals within normal limits, no acute ST-T wave change.   Recent Labs: 10/30/2015: ALT 18; BUN 24; Creatinine, Ser 1.02; Hemoglobin 13.3; Platelets 248; Potassium 4.2; Sodium 141    Lipid Panel    Component Value Date/Time   CHOL 168 11/12/2009 0000   TRIG 183.0 (H) 11/12/2009 0000   HDL 52.10 11/12/2009  0000   CHOLHDL 3 11/12/2009 0000   VLDL 36.6 11/12/2009 0000   LDLCALC 79 11/12/2009 0000      Wt Readings from Last 3 Encounters:  04/27/16 156 lb 9.6 oz (71 kg)  10/30/15 157 lb (71.2 kg)  06/12/12 160 lb (72.6 kg)      Other studies Reviewed: Additional studies/ records that were reviewed today include: Office records.  . Review of the above records demonstrates:  Please see elsewhere in the note.     ASSESSMENT AND PLAN:  CAD:  The patient has had nonobstructive disease.  I will bring the patient back for a POET (Plain Old Exercise Test). This will allow me to screen for obstructive coronary disease, risk stratify and very importantly provide a prescription for exercise.  HTN:  The blood pressure is elevated.   However she says that this is unusual and that when she takes it at home it is OK. No change in medications is indicated. We will continue with therapeutic lifestyle changes (TLC)..  She is going to keep a BP diary and let me know.   DYSLIPIDEMIA:   She has an excellent lipid profile.  LDL was 82, HDL 51.  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  Discussed diet and exercise.    Current medicines are reviewed at length with the patient today.  The patient does not have concerns regarding medicines.  The following changes have been made:  no change  Labs/ tests ordered today include:   Orders Placed This Encounter  Procedures  . EXERCISE TOLERANCE TEST  . EKG 12-Lead     Disposition:   FU with me as needed.      Signed, Janice Breeding, MD  04/28/2016 9:07 AM    [place]

## 2016-04-27 ENCOUNTER — Ambulatory Visit (INDEPENDENT_AMBULATORY_CARE_PROVIDER_SITE_OTHER): Payer: Medicare Other | Admitting: Cardiology

## 2016-04-27 ENCOUNTER — Encounter: Payer: Self-pay | Admitting: Cardiology

## 2016-04-27 VITALS — BP 164/90 | HR 75 | Ht 65.0 in | Wt 156.6 lb

## 2016-04-27 DIAGNOSIS — I10 Essential (primary) hypertension: Secondary | ICD-10-CM | POA: Diagnosis not present

## 2016-04-27 DIAGNOSIS — I2510 Atherosclerotic heart disease of native coronary artery without angina pectoris: Secondary | ICD-10-CM | POA: Diagnosis not present

## 2016-04-27 DIAGNOSIS — R0989 Other specified symptoms and signs involving the circulatory and respiratory systems: Secondary | ICD-10-CM | POA: Diagnosis not present

## 2016-04-27 NOTE — Patient Instructions (Signed)
Medication Instructions:  Continue current medications  Labwork: None Ordered  Testing/Procedures: Your physician has requested that you have an exercise tolerance test. For further information please visit [email or web address]. Please also follow instruction sheet, as given.  Your physician has requested that you have a carotid duplex. This test is an ultrasound of the carotid arteries in your neck. It looks at blood flow through these arteries that supply the brain with blood. Allow one hour for this exam. There are no restrictions or special instructions.   Follow-Up: Your physician recommends that you schedule a follow-up appointment in: As Needed   Any Other Special Instructions Will Be Listed Below (If Applicable).   If you need a refill on your cardiac medications before your next appointment, please call your pharmacy.

## 2016-04-28 ENCOUNTER — Encounter: Payer: Self-pay | Admitting: Cardiology

## 2016-04-29 ENCOUNTER — Telehealth (HOSPITAL_COMMUNITY): Payer: Self-pay

## 2016-04-29 NOTE — Telephone Encounter (Signed)
Encounter complete. 

## 2016-04-30 ENCOUNTER — Ambulatory Visit (HOSPITAL_COMMUNITY)
Admission: RE | Admit: 2016-04-30 | Discharge: 2016-04-30 | Disposition: A | Discharge status: Home / Self Care | Payer: Medicare Other | Source: Ambulatory Visit | Attending: Cardiology | Admitting: Cardiology

## 2016-04-30 DIAGNOSIS — R0989 Other specified symptoms and signs involving the circulatory and respiratory systems: Secondary | ICD-10-CM | POA: Diagnosis not present

## 2016-04-30 DIAGNOSIS — I6523 Occlusion and stenosis of bilateral carotid arteries: Secondary | ICD-10-CM

## 2016-05-04 ENCOUNTER — Ambulatory Visit (HOSPITAL_COMMUNITY)
Admission: RE | Admit: 2016-05-04 | Discharge: 2016-05-04 | Disposition: A | Discharge status: Home / Self Care | Payer: Medicare Other | Source: Ambulatory Visit | Attending: Cardiology | Admitting: Cardiology

## 2016-05-04 DIAGNOSIS — I2510 Atherosclerotic heart disease of native coronary artery without angina pectoris: Secondary | ICD-10-CM | POA: Diagnosis not present

## 2016-05-04 LAB — EXERCISE TOLERANCE TEST
Estimated workload: 7 METS
Exercise duration (min): 5 min
Exercise duration (sec): 14 s
MPHR: 147 {beats}/min
Peak HR: 126 {beats}/min
Percent HR: 85 %
RPE: 17
Rest HR: 75 {beats}/min

## 2016-05-13 ENCOUNTER — Encounter: Payer: Self-pay | Admitting: *Deleted

## 2016-05-18 ENCOUNTER — Telehealth: Payer: Self-pay | Admitting: Cardiology

## 2016-05-18 NOTE — Telephone Encounter (Signed)
Spoke with pt, aware of POET results

## 2016-05-18 NOTE — Telephone Encounter (Signed)
New Message   Patient returning call regarding test results. She received message while she was out of town to call.

## 2017-02-11 ENCOUNTER — Other Ambulatory Visit: Payer: Self-pay | Admitting: Family Medicine

## 2017-02-11 DIAGNOSIS — R921 Mammographic calcification found on diagnostic imaging of breast: Secondary | ICD-10-CM

## 2017-03-30 ENCOUNTER — Other Ambulatory Visit: Payer: Self-pay | Admitting: Family Medicine

## 2017-03-30 ENCOUNTER — Ambulatory Visit
Admission: RE | Admit: 2017-03-30 | Discharge: 2017-03-30 | Disposition: A | Discharge status: Home / Self Care | Payer: Medicare Other | Source: Ambulatory Visit | Attending: Family Medicine | Admitting: Family Medicine

## 2017-03-30 DIAGNOSIS — R921 Mammographic calcification found on diagnostic imaging of breast: Secondary | ICD-10-CM

## 2017-04-01 ENCOUNTER — Other Ambulatory Visit: Payer: Self-pay | Admitting: Family Medicine

## 2017-04-01 DIAGNOSIS — M8580 Other specified disorders of bone density and structure, unspecified site: Secondary | ICD-10-CM

## 2017-04-12 ENCOUNTER — Ambulatory Visit
Admission: RE | Admit: 2017-04-12 | Discharge: 2017-04-12 | Disposition: A | Discharge status: Home / Self Care | Payer: Medicare Other | Source: Ambulatory Visit | Attending: Family Medicine | Admitting: Family Medicine

## 2017-04-12 DIAGNOSIS — R921 Mammographic calcification found on diagnostic imaging of breast: Secondary | ICD-10-CM

## 2017-12-05 ENCOUNTER — Telehealth: Payer: Self-pay | Admitting: Cardiology

## 2017-12-05 NOTE — Telephone Encounter (Signed)
Agree 

## 2017-12-05 NOTE — Telephone Encounter (Signed)
Patient last seen by DR Hochein > 19 months ago.    Recommendation:   1. Please take an additional 1/2 tablet of metoprolol every morning (metoprolol 37.5mg  daily)  2. Stay hydrated and avoid outdoors activities during high temperatures  3. Monitor BP daily  4. Make appt with Dr Redmond Pulling next available for further assessment.

## 2017-12-05 NOTE — Telephone Encounter (Signed)
Spoke wit pt advised that per Pharm D she is take an extra 1/2 tablet daily of metoprolol to equal 75 mg along with other recommendation. Pt verbalized understanding. Appointment schedule to see Dr. Warren Lacy on 01/06/18 at 4pm.

## 2017-12-05 NOTE — Telephone Encounter (Signed)
Spoke with pt who states she has been having a big fluctuations in her systolic BP. She reports it has been ranging form 180's-140's and even got as high as 200 one time.  She states her diastolic is WNL but reports when she notice her pulse pressure gets over 100 she start feeling dizzy when she stand up and has to close her eyes for a min to get herself together. She reports she has been taking her Blood pressure medication as prescribed. Routing to Dr. Percival Spanish and Pharm D for recommendations.

## 2017-12-05 NOTE — Telephone Encounter (Signed)
New Message      Pt c/o BP issue: STAT if pt c/o blurred vision, one-sided weakness or slurred speech  1. What are your last 5 BP readings?   165/70   146/61   135/60   124/64   123/64 These bp readings were all taking yesterday.  2. Are you having any other symptoms (ex. Dizziness, headache, blurred vision, passed out)? Dizziness/dull headache  3. What is your BP issue? Patient bp is fluctuating. Pls advise.

## 2017-12-05 NOTE — Telephone Encounter (Signed)
Left message to call back  

## 2018-01-06 ENCOUNTER — Ambulatory Visit: Payer: Medicare Other | Admitting: Cardiology

## 2018-01-06 ENCOUNTER — Other Ambulatory Visit (INDEPENDENT_AMBULATORY_CARE_PROVIDER_SITE_OTHER): Payer: Self-pay | Admitting: Otolaryngology

## 2018-01-06 DIAGNOSIS — H918X9 Other specified hearing loss, unspecified ear: Secondary | ICD-10-CM

## 2018-01-13 ENCOUNTER — Ambulatory Visit (HOSPITAL_COMMUNITY)
Admission: RE | Admit: 2018-01-13 | Discharge: 2018-01-13 | Disposition: A | Discharge status: Home / Self Care | Payer: Medicare Other | Source: Ambulatory Visit | Attending: Otolaryngology | Admitting: Otolaryngology

## 2018-01-13 DIAGNOSIS — H9012 Conductive hearing loss, unilateral, left ear, with unrestricted hearing on the contralateral side: Secondary | ICD-10-CM | POA: Diagnosis present

## 2018-01-13 DIAGNOSIS — H918X9 Other specified hearing loss, unspecified ear: Secondary | ICD-10-CM

## 2018-01-13 LAB — POCT I-STAT CREATININE: Creatinine, Ser: 0.7 mg/dL (ref 0.44–1.00)

## 2018-01-13 MED ORDER — GADOBENATE DIMEGLUMINE 529 MG/ML IV SOLN
15.0000 mL | Freq: Once | INTRAVENOUS | Status: AC | PRN
  Administered 2018-01-13: 15 mL via INTRAVENOUS

## 2018-01-30 ENCOUNTER — Other Ambulatory Visit: Payer: Self-pay | Admitting: Obstetrics and Gynecology

## 2018-01-30 DIAGNOSIS — N644 Mastodynia: Secondary | ICD-10-CM

## 2018-02-23 NOTE — Progress Notes (Signed)
Cardiology Office Note   Date:  02/24/2018   ID:  Larya, Charpentier 11/25/1940, MRN [id number]  PCP:  Leighton Ruff, MD  Cardiologist:   Minus Breeding, MD  Referring:  Leighton Ruff, MD  No chief complaint on file.    History of Present Illness: [name] is a 77 y.o. female who presents for follow up of CAD.  In 2003 she had nonobstructive CAD with PDA 50% stenosis, diagonal 30% stenosis.  She had a negative POET (Plain Old Exercise Treadmill) in 2017 when I last saw her.   She returns for follow up.  Since I last saw her she is doing very well.  She continues to exercise every day and eat well.  She is been worried about the dose of beta-blocker she is been taking which she has been on higher dose prescribed because her blood pressure went up a little bit and she saw somebody outside of this practice.  She is worried because her hair is falling out.  She thinks it interferes with her sleep.  She does a lot of research on the Internet and she is worried about taking that drug at a higher dose.  She did have some very low blood pressures and so she reduced her enalapril from 20 mg to 10 mg.  She otherwise denies any cardiovascular symptoms. The patient denies any new symptoms such as chest discomfort, neck or arm discomfort. There has been no new shortness of breath, PND or orthopnea. There have been no reported palpitations, presyncope or syncope.    He Past Medical History:  Diagnosis Date  . Arthritis   . GERD (gastroesophageal reflux disease)   . High cholesterol   . Hypertension   . Osteoporosis     Past Surgical History:  Procedure Laterality Date  . ABDOMINAL HYSTERECTOMY    . BREAST EXCISIONAL BIOPSY Right      Current Outpatient Medications  Medication Sig Dispense Refill  . aspirin 81 MG tablet Take 81 mg by mouth daily.    . B Complex-C (SUPER B COMPLEX) TABS Take 1 tablet by mouth daily.    . cholecalciferol (VITAMIN D) 1000 UNITS tablet Take 1,000  Units by mouth daily.    . enalapril (VASOTEC) 10 MG tablet Take 20 mg by mouth daily.  1  . fish oil-omega-3 fatty acids 1000 MG capsule Take 1 g by mouth daily.    . Flaxseed, Linseed, (FLAXSEED OIL) 1000 MG CAPS Take 1,000 mg by mouth daily.    . metoprolol succinate (TOPROL-XL) 25 MG 24 hr tablet Take 50 mg by mouth daily.     . Multiple Vitamin (MULITIVITAMIN WITH MINERALS) TABS Take 1 tablet by mouth daily.     No current facility-administered medications for this visit.     Allergies:   Other; Epinephrine; Ioxaglate; and Ivp dye [iodinated diagnostic agents]     ROS:  Please see the history of present illness.   Otherwise, review of systems are positive for none.   All other systems are reviewed and negative.    PHYSICAL EXAM: VS:  BP (!) 142/72   Pulse 63   Ht 5\' 5"  (1.651 m)   Wt 158 lb (71.7 kg)   SpO2 97%   BMI 26.29 kg/m  , BMI Body mass index is 26.29 kg/m. GENERAL:  Well appearing NECK:  No jugular venous distention, waveform within normal limits, carotid upstroke brisk and symmetric, no bruits, no thyromegaly LUNGS:  Clear to auscultation bilaterally  CHEST:  Unremarkable HEART:  PMI not displaced or sustained,S1 and S2 within normal limits, no S3, no S4, no clicks, no rubs, no murmurs ABD:  Flat, positive bowel sounds normal in frequency in pitch, no bruits, no rebound, no guarding, no midline pulsatile mass, no hepatomegaly, no splenomegaly EXT:  2 plus pulses throughout, no edema, no cyanosis no clubbing   EKG:  EKG is  ordered today. The ekg ordered today demonstrates normal sinus rhythm, rate 63, axis within normal limits, intervals within normal limits, no acute ST-T wave change.   Recent Labs: 01/13/2018: Creatinine, Ser 0.70    Lipid Panel    Component Value Date/Time   CHOL 168 11/12/2009 0000   TRIG 183.0 (H) 11/12/2009 0000   HDL 52.10 11/12/2009 0000   CHOLHDL 3 11/12/2009 0000   VLDL 36.6 11/12/2009 0000   LDLCALC 79 11/12/2009 0000       Wt Readings from Last 3 Encounters:  02/24/18 158 lb (71.7 kg)  04/27/16 156 lb 9.6 oz (71 kg)  10/30/15 157 lb (71.2 kg)      Other studies Reviewed: Additional studies/ records that were reviewed today include:  Labs Review of the above records demonstrates:     ASSESSMENT AND PLAN:  CAD:  The patient has no new sypmtoms.  No further cardiovascular testing is indicated.  We will continue with aggressive risk reduction and meds as listed.  HTN:     ThHe is controlled.  However, she is having hair loss and other symptoms that may not be related to the beta-blocker.  Her loss does happen and I think it is reasonable to reduce the dose back to 25 mg of her metoprolol.  She is going to probably leave the 20 mg of enalapril back.  If she still losing her hair in a month or so she is going to stop the metoprolol completely.  She will communicate with me via My Chart to keep a track on her blood pressures and see if she needs further adjustments.    DYSLIPIDEMIA:   Her LDL was 84 with HDL of 55.  No change in therapy.  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  She watches this through diet.  Current medicines are reviewed at length with the patient today.  The patient does not have concerns regarding medicines.  The following changes have been made:  As above  Labs/ tests ordered today include:  None  No orders of the defined types were placed in this encounter.    Disposition:   FU with me in 12 months.    Signed, Minus Breeding, MD  02/24/2018 3:42 PM     [facility]

## 2018-02-24 ENCOUNTER — Ambulatory Visit: Payer: Medicare Other | Admitting: Cardiology

## 2018-02-24 ENCOUNTER — Encounter: Payer: Self-pay | Admitting: Cardiology

## 2018-02-24 VITALS — BP 142/72 | HR 63 | Ht 65.0 in | Wt 158.0 lb

## 2018-02-24 DIAGNOSIS — I2510 Atherosclerotic heart disease of native coronary artery without angina pectoris: Secondary | ICD-10-CM | POA: Diagnosis not present

## 2018-02-24 DIAGNOSIS — R7301 Impaired fasting glucose: Secondary | ICD-10-CM | POA: Insufficient documentation

## 2018-02-24 DIAGNOSIS — R0989 Other specified symptoms and signs involving the circulatory and respiratory systems: Secondary | ICD-10-CM | POA: Insufficient documentation

## 2018-02-24 DIAGNOSIS — N200 Calculus of kidney: Secondary | ICD-10-CM | POA: Insufficient documentation

## 2018-02-24 DIAGNOSIS — K219 Gastro-esophageal reflux disease without esophagitis: Secondary | ICD-10-CM | POA: Insufficient documentation

## 2018-02-24 DIAGNOSIS — Z1211 Encounter for screening for malignant neoplasm of colon: Secondary | ICD-10-CM | POA: Insufficient documentation

## 2018-02-24 DIAGNOSIS — M8588 Other specified disorders of bone density and structure, other site: Secondary | ICD-10-CM | POA: Insufficient documentation

## 2018-02-24 DIAGNOSIS — D649 Anemia, unspecified: Secondary | ICD-10-CM | POA: Insufficient documentation

## 2018-02-24 DIAGNOSIS — E782 Mixed hyperlipidemia: Secondary | ICD-10-CM | POA: Insufficient documentation

## 2018-02-24 DIAGNOSIS — I10 Essential (primary) hypertension: Secondary | ICD-10-CM | POA: Diagnosis not present

## 2018-02-24 DIAGNOSIS — G4700 Insomnia, unspecified: Secondary | ICD-10-CM | POA: Insufficient documentation

## 2018-02-24 DIAGNOSIS — E559 Vitamin D deficiency, unspecified: Secondary | ICD-10-CM | POA: Insufficient documentation

## 2018-02-24 DIAGNOSIS — E785 Hyperlipidemia, unspecified: Secondary | ICD-10-CM

## 2018-02-24 DIAGNOSIS — F40243 Fear of flying: Secondary | ICD-10-CM | POA: Insufficient documentation

## 2018-02-24 DIAGNOSIS — F419 Anxiety disorder, unspecified: Secondary | ICD-10-CM | POA: Insufficient documentation

## 2018-02-24 MED ORDER — METOPROLOL SUCCINATE ER 25 MG PO TB24: 25.0000 mg | ORAL_TABLET | Freq: Every day | ORAL | 3 refills | Status: DC

## 2018-02-24 MED ORDER — ENALAPRIL MALEATE 20 MG PO TABS: 20.0000 mg | ORAL_TABLET | Freq: Every day | ORAL | 3 refills | Status: DC

## 2018-02-24 NOTE — Patient Instructions (Addendum)
Medication Instructions:  INCREASE- Enalapril 20 mg daily DECREASE- Metoprolol Succinate 25 mg daily  If you need a refill on your cardiac medications before your next appointment, please call your pharmacy.  Labwork: None Ordered  If you have labs (blood work) drawn today and your tests are completely normal, you will receive your results only by: Marland Kitchen MyChart Message (if you have MyChart) OR . A paper copy in the mail If you have any lab test that is abnormal or we need to change your treatment, we will call you to review the results.  Testing/Procedures: None Ordered  Follow-Up: You will need a follow up appointment in 1 Year.  Please call our office 2 months in advance([phone]) to schedule the appointment.  You may see  DR Percival Spanish or one of the following Advanced Practice Providers on your designated Care Team:   . Rosaria Ferries, PA-C . Jory Sims, DNP, ANP  At [facility], you and your health needs are our priority.  As part of our continuing mission to provide you with exceptional heart care, we have created designated Provider Care Teams.  These Care Teams include your primary Cardiologist (physician) and Advanced Practice Providers (APPs -  Physician Assistants and Nurse Practitioners) who all work together to provide you with the care you need, when you need it.    Thank you for choosing [facility] at [facility]!!

## 2018-03-22 ENCOUNTER — Other Ambulatory Visit: Payer: Self-pay | Admitting: Family Medicine

## 2018-03-22 DIAGNOSIS — Z1231 Encounter for screening mammogram for malignant neoplasm of breast: Secondary | ICD-10-CM

## 2018-04-03 ENCOUNTER — Other Ambulatory Visit: Payer: Medicare Other

## 2018-04-04 ENCOUNTER — Other Ambulatory Visit: Payer: Self-pay | Admitting: Family Medicine

## 2018-04-04 DIAGNOSIS — N6459 Other signs and symptoms in breast: Secondary | ICD-10-CM

## 2018-04-04 DIAGNOSIS — N63 Unspecified lump in unspecified breast: Secondary | ICD-10-CM

## 2018-04-25 ENCOUNTER — Ambulatory Visit
Admission: RE | Admit: 2018-04-25 | Discharge: 2018-04-25 | Disposition: A | Discharge status: Home / Self Care | Payer: Medicare Other | Source: Ambulatory Visit | Attending: Family Medicine | Admitting: Family Medicine

## 2018-04-25 ENCOUNTER — Other Ambulatory Visit: Payer: Medicare Other

## 2018-04-25 ENCOUNTER — Other Ambulatory Visit: Payer: Self-pay | Admitting: Family Medicine

## 2018-04-25 DIAGNOSIS — N6459 Other signs and symptoms in breast: Secondary | ICD-10-CM

## 2018-04-25 DIAGNOSIS — Z1231 Encounter for screening mammogram for malignant neoplasm of breast: Secondary | ICD-10-CM

## 2018-04-25 DIAGNOSIS — N63 Unspecified lump in unspecified breast: Secondary | ICD-10-CM

## 2018-05-05 ENCOUNTER — Ambulatory Visit: Payer: Medicare Other

## 2018-06-11 DIAGNOSIS — N649 Disorder of breast, unspecified: Secondary | ICD-10-CM | POA: Insufficient documentation

## 2018-07-28 ENCOUNTER — Other Ambulatory Visit: Payer: Medicare Other

## 2019-07-16 ENCOUNTER — Other Ambulatory Visit: Payer: Self-pay | Admitting: Family Medicine

## 2019-07-16 DIAGNOSIS — Z1231 Encounter for screening mammogram for malignant neoplasm of breast: Secondary | ICD-10-CM

## 2019-08-16 ENCOUNTER — Ambulatory Visit: Payer: Medicare Other

## 2019-08-27 ENCOUNTER — Ambulatory Visit: Payer: Medicare PPO

## 2019-08-30 ENCOUNTER — Ambulatory Visit
Admission: RE | Admit: 2019-08-30 | Discharge: 2019-08-30 | Disposition: A | Discharge status: Home / Self Care | Payer: Medicare PPO | Source: Ambulatory Visit | Attending: Family Medicine | Admitting: Family Medicine

## 2019-08-30 ENCOUNTER — Other Ambulatory Visit: Payer: Self-pay

## 2019-08-30 DIAGNOSIS — Z1231 Encounter for screening mammogram for malignant neoplasm of breast: Secondary | ICD-10-CM

## 2019-09-04 ENCOUNTER — Other Ambulatory Visit: Payer: Self-pay | Admitting: Family Medicine

## 2019-09-04 DIAGNOSIS — R928 Other abnormal and inconclusive findings on diagnostic imaging of breast: Secondary | ICD-10-CM

## 2019-09-12 ENCOUNTER — Ambulatory Visit
Admission: RE | Admit: 2019-09-12 | Discharge: 2019-09-12 | Disposition: A | Discharge status: Home / Self Care | Payer: Medicare PPO | Source: Ambulatory Visit | Attending: Family Medicine | Admitting: Family Medicine

## 2019-09-12 ENCOUNTER — Ambulatory Visit: Payer: Medicare PPO

## 2019-09-12 ENCOUNTER — Other Ambulatory Visit: Payer: Self-pay | Admitting: Family Medicine

## 2019-09-12 ENCOUNTER — Other Ambulatory Visit: Payer: Self-pay

## 2019-09-12 DIAGNOSIS — R928 Other abnormal and inconclusive findings on diagnostic imaging of breast: Secondary | ICD-10-CM

## 2020-03-17 ENCOUNTER — Emergency Department (HOSPITAL_BASED_OUTPATIENT_CLINIC_OR_DEPARTMENT_OTHER)
Admission: EM | Admit: 2020-03-17 | Discharge: 2020-03-17 | Disposition: A | Discharge status: Home / Self Care | Payer: Medicare PPO | Attending: Emergency Medicine | Admitting: Emergency Medicine

## 2020-03-17 ENCOUNTER — Encounter (HOSPITAL_BASED_OUTPATIENT_CLINIC_OR_DEPARTMENT_OTHER): Payer: Self-pay

## 2020-03-17 ENCOUNTER — Other Ambulatory Visit: Payer: Self-pay

## 2020-03-17 DIAGNOSIS — S61439A Puncture wound without foreign body of unspecified hand, initial encounter: Secondary | ICD-10-CM | POA: Diagnosis not present

## 2020-03-17 DIAGNOSIS — Z5321 Procedure and treatment not carried out due to patient leaving prior to being seen by health care provider: Secondary | ICD-10-CM | POA: Diagnosis not present

## 2020-03-17 DIAGNOSIS — W5911XA Bitten by nonvenomous snake, initial encounter: Secondary | ICD-10-CM | POA: Insufficient documentation

## 2020-03-17 NOTE — ED Triage Notes (Signed)
Pt arrives following snake bite today, went to PCP walk in clinic and was told to come to ED. Pt has snake in bag. Small puncture wound to top of hand no redness noted at this time. Pt reports bite happened at 2:15 today.

## 2020-03-17 NOTE — ED Triage Notes (Addendum)
Pt did say she took one benadryl after the bite.  Speaking with poison control - since patient has minimal to no symptoms optional coags, watch 6 hours since time of bite, and update tetanus, no ice and preform wound care. Ebony Hail at [place] control took patients phone number and will call patient for follow up at home tomorrow, will also fax resources for patient to ED.

## 2020-03-17 NOTE — ED Notes (Signed)
Pt found sitting by the [facility]

## 2020-03-18 DIAGNOSIS — W5911XD Bitten by nonvenomous snake, subsequent encounter: Secondary | ICD-10-CM | POA: Diagnosis not present

## 2020-03-18 DIAGNOSIS — Z6825 Body mass index (BMI) 25.0-25.9, adult: Secondary | ICD-10-CM | POA: Diagnosis not present

## 2020-03-18 DIAGNOSIS — S61459D Open bite of unspecified hand, subsequent encounter: Secondary | ICD-10-CM | POA: Diagnosis not present

## 2020-03-18 DIAGNOSIS — I10 Essential (primary) hypertension: Secondary | ICD-10-CM | POA: Diagnosis not present

## 2020-04-16 DIAGNOSIS — L281 Prurigo nodularis: Secondary | ICD-10-CM | POA: Diagnosis not present

## 2020-04-16 DIAGNOSIS — L819 Disorder of pigmentation, unspecified: Secondary | ICD-10-CM | POA: Diagnosis not present

## 2020-04-16 DIAGNOSIS — L821 Other seborrheic keratosis: Secondary | ICD-10-CM | POA: Diagnosis not present

## 2020-04-23 DIAGNOSIS — R079 Chest pain, unspecified: Secondary | ICD-10-CM | POA: Diagnosis not present

## 2020-04-24 ENCOUNTER — Emergency Department (HOSPITAL_COMMUNITY): Payer: Medicare PPO

## 2020-04-24 ENCOUNTER — Other Ambulatory Visit: Payer: Self-pay

## 2020-04-24 ENCOUNTER — Encounter (HOSPITAL_COMMUNITY): Payer: Self-pay | Admitting: Emergency Medicine

## 2020-04-24 ENCOUNTER — Emergency Department (HOSPITAL_COMMUNITY)
Admission: EM | Admit: 2020-04-24 | Discharge: 2020-04-24 | Disposition: A | Discharge status: Home / Self Care | Payer: Medicare PPO | Attending: Emergency Medicine | Admitting: Emergency Medicine

## 2020-04-24 DIAGNOSIS — R0602 Shortness of breath: Secondary | ICD-10-CM | POA: Diagnosis not present

## 2020-04-24 DIAGNOSIS — Z7982 Long term (current) use of aspirin: Secondary | ICD-10-CM | POA: Insufficient documentation

## 2020-04-24 DIAGNOSIS — I10 Essential (primary) hypertension: Secondary | ICD-10-CM | POA: Diagnosis not present

## 2020-04-24 DIAGNOSIS — R000 Tachycardia, unspecified: Secondary | ICD-10-CM | POA: Diagnosis not present

## 2020-04-24 DIAGNOSIS — I2510 Atherosclerotic heart disease of native coronary artery without angina pectoris: Secondary | ICD-10-CM | POA: Diagnosis not present

## 2020-04-24 DIAGNOSIS — R079 Chest pain, unspecified: Secondary | ICD-10-CM | POA: Insufficient documentation

## 2020-04-24 DIAGNOSIS — Z79899 Other long term (current) drug therapy: Secondary | ICD-10-CM | POA: Diagnosis not present

## 2020-04-24 LAB — CBC
HCT: 42.7 % (ref 36.0–46.0)
Hemoglobin: 13.9 g/dL (ref 12.0–15.0)
MCH: 29.8 pg (ref 26.0–34.0)
MCHC: 32.6 g/dL (ref 30.0–36.0)
MCV: 91.6 fL (ref 80.0–100.0)
Platelets: 255 10*3/uL (ref 150–400)
RBC: 4.66 MIL/uL (ref 3.87–5.11)
RDW: 12.9 % (ref 11.5–15.5)
WBC: 5.1 10*3/uL (ref 4.0–10.5)
nRBC: 0 % (ref 0.0–0.2)

## 2020-04-24 LAB — D-DIMER, QUANTITATIVE: D-Dimer, Quant: 0.27 ug/mL-FEU (ref 0.00–0.50)

## 2020-04-24 LAB — TROPONIN I (HIGH SENSITIVITY)
Troponin I (High Sensitivity): 8 ng/L (ref <18)
Troponin I (High Sensitivity): 9 ng/L (ref <18)

## 2020-04-24 LAB — BASIC METABOLIC PANEL
Anion gap: 13 (ref 5–15)
BUN: 14 mg/dL (ref 8–23)
CO2: 24 mmol/L (ref 22–32)
Calcium: 9.5 mg/dL (ref 8.9–10.3)
Chloride: 103 mmol/L (ref 98–111)
Creatinine, Ser: 0.68 mg/dL (ref 0.44–1.00)
GFR, Estimated: >60 mL/min (ref >60)
Glucose, Bld: 112 mg/dL — ABNORMAL HIGH (ref 70–99)
Potassium: 3.7 mmol/L (ref 3.5–5.1)
Sodium: 140 mmol/L (ref 135–145)

## 2020-04-24 MED ORDER — ASPIRIN 81 MG PO CHEW
324.0000 mg | CHEWABLE_TABLET | Freq: Once | ORAL | Status: DC
  Filled 2020-04-24: qty 4

## 2020-04-24 MED ORDER — NITROGLYCERIN 0.4 MG SL SUBL: 0.4000 mg | SUBLINGUAL_TABLET | SUBLINGUAL | Status: DC | PRN

## 2020-04-24 MED ORDER — NITROGLYCERIN IN D5W 200-5 MCG/ML-% IV SOLN
0.0000 ug/min | INTRAVENOUS | Status: DC
  Administered 2020-04-24: 5 ug/min via INTRAVENOUS
  Filled 2020-04-24: qty 250

## 2020-04-24 MED ORDER — NITROGLYCERIN 2 % TD OINT: 1.0000 [in_us] | TOPICAL_OINTMENT | Freq: Once | TRANSDERMAL | Status: DC

## 2020-04-24 NOTE — Discharge Instructions (Signed)
Return for worsening pain.  Follow up with your cardiologist.

## 2020-04-24 NOTE — ED Provider Notes (Signed)
[facility] DEPT Provider Note   CSN: [id number] Arrival date & time: 04/24/20  0020     History No chief complaint on file.   [name] is a 79 y.o. female.  79 yo F with a cc of chest pain.  Felt like heartburn.  Started a couple days ago.  Denies specific exertional symptoms.  Has been mildly short of breath.  Denies cough congestion or fever.  Denies abdominal pain.  She went to her GI doctor concern for heartburn and they sent her to her family doctor.  Sent off blood work and an EKG.  Troponin had resulted in elevated and she was then told to come to the ED.  Feels like her pain is somewhat worse now than it was a couple days ago.  Had a history of cardiac catheterization done about 21 years ago where she said she had some mild coronary disease but none that required stenting.  The history is provided by the patient.  Illness Severity:  Moderate Onset quality:  Gradual Duration:  2 days Timing:  Constant Progression:  Worsening Chronicity:  New Associated symptoms: chest pain and shortness of breath   Associated symptoms: no congestion, no fever, no headaches, no myalgias, no nausea, no rhinorrhea, no vomiting and no wheezing        Past Medical History:  Diagnosis Date  . Arthritis   . GERD (gastroesophageal reflux disease)   . High cholesterol   . Hypertension   . Osteoporosis     Patient Active Problem List   Diagnosis Date Noted  . Anemia 02/24/2018  . Anxiety disorder 02/24/2018  . Nephrolithiasis 02/24/2018  . Kidney stone 02/24/2018  . Carotid bruit 02/24/2018  . Colon cancer screening 02/24/2018  . Gastro-esophageal reflux disease without esophagitis 02/24/2018  . Impaired fasting glucose 02/24/2018  . Insomnia 02/24/2018  . Other specified disorders of bone density and structure, other site 02/24/2018  . Vitamin D deficiency 02/24/2018  . Coronary artery disease 02/24/2018  . Mixed hyperlipidemia 02/24/2018  .  Vulvar lesion 05/23/2015  . Essential hypertension 05/23/2015  . Hemorrhoids 06/12/2012  . CORONARY ATHEROSCLEROSIS NATIVE CORONARY ARTERY 11/12/2009  . CAROTID BRUIT 11/12/2009  . DYSLIPIDEMIA 11/11/2009  . HYPERTENSION 11/11/2009    Past Surgical History:  Procedure Laterality Date  . ABDOMINAL HYSTERECTOMY    . BREAST EXCISIONAL BIOPSY Right      OB History   No obstetric history on file.     Family History  Problem Relation Age of Onset  . Liver cancer Other   . Diabetes Mother     Social History   Tobacco Use  . Smoking status: Never Smoker  . Smokeless tobacco: Never Used  Substance Use Topics  . Alcohol use: No  . Drug use: No    Home Medications Prior to Admission medications   Medication Sig Start Date End Date Taking? Authorizing Provider  aspirin 81 MG tablet Take 81 mg by mouth daily.    [provider]  B Complex-C (SUPER B COMPLEX) TABS Take 1 tablet by mouth daily.    [provider]  cholecalciferol (VITAMIN D) 1000 UNITS tablet Take 1,000 Units by mouth daily.    [provider]  enalapril (VASOTEC) 20 MG tablet Take 1 tablet (20 mg total) by mouth daily. 02/24/18   Minus Breeding, MD  fish oil-omega-3 fatty acids 1000 MG capsule Take 1 g by mouth daily.    [provider]  Flaxseed, Linseed, (FLAXSEED OIL)  1000 MG CAPS Take 1,000 mg by mouth daily.    [provider]  metoprolol succinate (TOPROL-XL) 25 MG 24 hr tablet Take 1 tablet (25 mg total) by mouth daily. 02/24/18   Minus Breeding, MD  Multiple Vitamin (MULITIVITAMIN WITH MINERALS) TABS Take 1 tablet by mouth daily.    [provider]    Allergies    Other, Epinephrine, Ioxaglate, and Ivp dye [iodinated diagnostic agents]  Review of Systems   Review of Systems  Constitutional: Negative for chills and fever.  HENT: Negative for congestion and rhinorrhea.   Eyes: Negative for redness and visual disturbance.  Respiratory: Positive  for shortness of breath. Negative for wheezing.   Cardiovascular: Positive for chest pain. Negative for palpitations.  Gastrointestinal: Negative for nausea and vomiting.  Genitourinary: Negative for dysuria and urgency.  Musculoskeletal: Negative for arthralgias and myalgias.  Skin: Negative for pallor and wound.  Neurological: Negative for dizziness and headaches.    Physical Exam Updated Vital Signs BP (!) 164/76   Pulse 93   Temp 98.1 F (36.7 C) (Oral)   Resp 12   Ht 4\' 11"  (1.499 m)   Wt 67.1 kg   SpO2 97%   BMI 29.88 kg/m   Physical Exam Vitals and nursing note reviewed.  Constitutional:      General: She is not in acute distress.    Appearance: She is well-developed and well-nourished. She is not diaphoretic.  HENT:     Head: Normocephalic and atraumatic.  Eyes:     Extraocular Movements: EOM normal.     Pupils: Pupils are equal, round, and reactive to light.  Cardiovascular:     Rate and Rhythm: Normal rate and regular rhythm.     Heart sounds: No murmur heard. No friction rub. No gallop.   Pulmonary:     Effort: Pulmonary effort is normal.     Breath sounds: No wheezing or rales.  Abdominal:     General: There is no distension.     Palpations: Abdomen is soft.     Tenderness: There is no abdominal tenderness.  Musculoskeletal:        General: No tenderness or edema.     Cervical back: Normal range of motion and neck supple.  Skin:    General: Skin is warm and dry.  Neurological:     Mental Status: She is alert and oriented to person, place, and time.  Psychiatric:        Mood and Affect: Mood and affect normal.        Behavior: Behavior normal.     ED Results / Procedures / Treatments   Labs (all labs ordered are listed, but only abnormal results are displayed) Labs Reviewed  BASIC METABOLIC PANEL - Abnormal; Notable for the following components:      Result Value   Glucose, Bld 112 (*)    All other components within normal limits  CBC   D-DIMER, QUANTITATIVE (NOT AT [facility])  TROPONIN I (HIGH SENSITIVITY)  TROPONIN I (HIGH SENSITIVITY)    EKG EKG Interpretation  Date/Time:  Thursday April 24 2020 00:40:36 EST Ventricular Rate:  104 PR Interval:    QRS Duration: 89 QT Interval:  345 QTC Calculation: 454 R Axis:   14 Text Interpretation: Sinus tachycardia 12 Lead; [place] No significant change since last tracing Confirmed by Deno Etienne [phone]) on 04/24/2020 1:09:41 AM   Radiology DG Chest 2 View  Result Date: 04/24/2020 CLINICAL DATA:  Chest pain EXAM: CHEST - 2 VIEW  COMPARISON:  None. FINDINGS: The heart size and mediastinal contours are within normal limits. Both lungs are clear. The visualized skeletal structures are unremarkable. IMPRESSION: No active cardiopulmonary disease. Electronically Signed   By: Prudencio Pair M.D.   On: 04/24/2020 01:08    Procedures Procedures (including critical care time)  Medications Ordered in ED Medications  aspirin chewable tablet 324 mg (324 mg Oral Not Given 04/24/20 0258)  nitroGLYCERIN (NITROSTAT) SL tablet 0.4 mg (has no administration in time range)  nitroGLYCERIN 50 mg in dextrose 5 % 250 mL (0.2 mg/mL) infusion (0 mcg/min Intravenous Stopped 04/24/20 0416)    ED Course  I have reviewed the triage vital signs and the nursing notes.  Pertinent labs & imaging results that were available during my care of the patient were reviewed by me and considered in my medical decision making (see chart for details).    MDM Rules/Calculators/A&P                          79 yo F with a chief complaint of chest pain.  This been going on for a couple days now.  Was seen by her family doctor in the office and reportedly had a positive troponin and was told to come to the ED for evaluation.  Unfortunately I am unable to see the results in the computer.  We will send off a troponin here.  EKG without significant changes viewed by me.  CXR viewed by me without focal infiltrate or  pneumothorax.  Patient's initial troponin is negative.  I discussed with the patient that I am not sure exactly what was elevated in the office and so I will add on a D-dimer.  The patient was able to play the message for me and it sounds like her troponin was 7 which is technically negative.  I discussed this with her.  At this point she would like to try and go home.  I encouraged her to wait for a second troponin.  I feel like her heart score is elevated and had discussed this with her.  She is currently electing for outpatient follow-up but will wait for D-dimer and troponin to return.   Second trop negative.  Ddimer negative. Discussed with family, offered obs admission and she is declining.  PCP and cards follow up.   The patients results and plan were reviewed and discussed.   Any x-rays performed were independently reviewed by myself.   Differential diagnosis were considered with the presenting HPI.  Medications  aspirin chewable tablet 324 mg (324 mg Oral Not Given 04/24/20 0258)  nitroGLYCERIN (NITROSTAT) SL tablet 0.4 mg (has no administration in time range)  nitroGLYCERIN 50 mg in dextrose 5 % 250 mL (0.2 mg/mL) infusion (0 mcg/min Intravenous Stopped 04/24/20 0416)    Vitals:   04/24/20 0330 04/24/20 0335 04/24/20 0355 04/24/20 0400  BP: (!) 165/82 (!) 160/83 (!) 151/80 (!) 164/76  Pulse: 94 95 93 93  Resp: 12 12 12 12   Temp:      TempSrc:      SpO2: 99% 99% 99% 97%  Weight:      Height:        Final diagnoses:  Nonspecific chest pain    Admission/ observation were discussed with the admitting physician, patient and/or family and they are comfortable with the plan.    Final Clinical Impression(s) / ED Diagnoses Final diagnoses:  Nonspecific chest pain    Rx / DC Orders  ED Discharge Orders    None       Deno Etienne, [place] 04/24/20 0424

## 2020-04-28 DIAGNOSIS — F419 Anxiety disorder, unspecified: Secondary | ICD-10-CM | POA: Diagnosis not present

## 2020-04-28 DIAGNOSIS — R739 Hyperglycemia, unspecified: Secondary | ICD-10-CM | POA: Diagnosis not present

## 2020-04-28 DIAGNOSIS — I2510 Atherosclerotic heart disease of native coronary artery without angina pectoris: Secondary | ICD-10-CM | POA: Diagnosis not present

## 2020-04-28 DIAGNOSIS — R7303 Prediabetes: Secondary | ICD-10-CM | POA: Diagnosis not present

## 2020-04-28 DIAGNOSIS — I10 Essential (primary) hypertension: Secondary | ICD-10-CM | POA: Diagnosis not present

## 2020-04-28 DIAGNOSIS — E782 Mixed hyperlipidemia: Secondary | ICD-10-CM | POA: Diagnosis not present

## 2020-04-29 DIAGNOSIS — I2510 Atherosclerotic heart disease of native coronary artery without angina pectoris: Secondary | ICD-10-CM | POA: Diagnosis not present

## 2020-04-29 DIAGNOSIS — R7303 Prediabetes: Secondary | ICD-10-CM | POA: Diagnosis not present

## 2020-04-29 DIAGNOSIS — F419 Anxiety disorder, unspecified: Secondary | ICD-10-CM | POA: Diagnosis not present

## 2020-04-29 DIAGNOSIS — R7309 Other abnormal glucose: Secondary | ICD-10-CM | POA: Diagnosis not present

## 2020-04-29 DIAGNOSIS — E782 Mixed hyperlipidemia: Secondary | ICD-10-CM | POA: Diagnosis not present

## 2020-04-29 DIAGNOSIS — R739 Hyperglycemia, unspecified: Secondary | ICD-10-CM | POA: Diagnosis not present

## 2020-04-29 DIAGNOSIS — I10 Essential (primary) hypertension: Secondary | ICD-10-CM | POA: Diagnosis not present

## 2020-05-23 ENCOUNTER — Ambulatory Visit: Payer: Medicare PPO | Admitting: Cardiology

## 2020-06-24 DIAGNOSIS — L608 Other nail disorders: Secondary | ICD-10-CM | POA: Diagnosis not present

## 2020-06-29 NOTE — Progress Notes (Unsigned)
Cardiology Office Note   Date:  06/30/2020   ID:  [name], [name] 1940/09/01, MRN [id number]  PCP:  Kathyrn Lass, MD  Cardiologist:   Minus Breeding, MD  Referring:  Kathyrn Lass, MD  Chief Complaint  Patient presents with  . Chest Pain     History of Present Illness: [name] is a 80 y.o. female who presents for follow up of CAD.  In 2003 she had nonobstructive CAD with PDA 50% stenosis, diagonal 30% stenosis.  She had a negative POET (Plain Old Exercise Treadmill) in 2017.    Since I saw her she did have chest pain.  She went to the emergency room.  I reviewed these records.  She said she initially went to a walk-in clinic and they did blood work which she reports included an elevated troponin.  I do not have that result.  However, when she was in the emergency room her EKG and troponin x2 was negative.  D-dimer was normal.  She was thought to have nonanginal pain.  This was sharp pain that was happening only on that day.  Since then she has had resolution and she has had no further symptoms.  She does her walking 30 minutes a day and she does not get any chest pressure, neck or arm discomfort.  She does not have any shortness of breath, PND or orthopnea.  She has no palpitations, presyncope or syncope.  She does complain of a cough.  She wonders if this could be related to the enalapril.  She also reports that her blood pressure which typically has systolics in the 595G and sometimes going to the 190s or so if she is stressed.  This happens rarely.  She is also complaining of right great toe pain.  She wonders if this could be gout.  This happened in the middle of the night once and she never had this before.  She had some redness and swelling there.  She has had elevated triglycerides.  These were 313.  LDL was 149.  She was recently started on fenofibrate and has been taking this now for greater than 8 weeks.  She is also has a nodule that she has noticed on her  thyroid.  Past Medical History:  Diagnosis Date  . Arthritis   . GERD (gastroesophageal reflux disease)   . High cholesterol   . Hypertension   . Osteoporosis     Past Surgical History:  Procedure Laterality Date  . ABDOMINAL HYSTERECTOMY    . BREAST EXCISIONAL BIOPSY Right      Current Outpatient Medications  Medication Sig Dispense Refill  . aspirin 81 MG tablet Take 81 mg by mouth daily.    . B Complex-C (SUPER B COMPLEX) TABS Take 1 tablet by mouth daily.    . candesartan (ATACAND) 8 MG tablet Take 1 tablet (8 mg total) by mouth daily. 90 tablet 3  . cholecalciferol (VITAMIN D) 1000 UNITS tablet Take 1,000 Units by mouth daily.    . fenofibrate 54 MG tablet 54 mg 3 (three) times a week.    . fish oil-omega-3 fatty acids 1000 MG capsule Take 1 g by mouth daily.    . Flaxseed, Linseed, (FLAXSEED OIL) 1000 MG CAPS Take 1,000 mg by mouth daily.    . Multiple Vitamin (MULITIVITAMIN WITH MINERALS) TABS Take 1 tablet by mouth daily.     No current facility-administered medications for this visit.    Allergies:   Crestor [rosuvastatin], Lipitor [  atorvastatin], Other, Epinephrine, Ioxaglate, and Ivp dye [iodinated diagnostic agents]     ROS:  Please see the history of present illness.   Otherwise, review of systems are positive for none.   All other systems are reviewed and negative.    PHYSICAL EXAM: VS:  BP (!) 154/100   Pulse 78   Ht 5\' 5"  (1.651 m)   Wt 147 lb (66.7 kg)   SpO2 99%   BMI 24.46 kg/m  , BMI Body mass index is 24.46 kg/m. GENERAL:  Well appearing NECK:  No jugular venous distention, waveform within normal limits, carotid upstroke brisk and symmetric, no bruits, no thyromegaly LUNGS:  Clear to auscultation bilaterally CHEST:  Unremarkable HEART:  PMI not displaced or sustained,S1 and S2 within normal limits, no S3, no S4, no clicks, no rubs, no murmurs ABD:  Flat, positive bowel sounds normal in frequency in pitch, no bruits, no rebound, no guarding, no  midline pulsatile mass, no hepatomegaly, no splenomegaly EXT:  2 plus pulses throughout, no edema, no cyanosis no clubbing    EKG:  EKG is  ordered today. The ekg ordered today demonstrates normal sinus rhythm, rate 78, axis within normal limits, intervals within normal limits, no acute ST-T wave change.   Recent Labs: 04/24/2020: BUN 14; Creatinine, Ser 0.68; Hemoglobin 13.9; Platelets 255; Potassium 3.7; Sodium 140    Lipid Panel    Component Value Date/Time   CHOL 168 11/12/2009 0000   TRIG 183.0 (H) 11/12/2009 0000   HDL 52.10 11/12/2009 0000   CHOLHDL 3 11/12/2009 0000   VLDL 36.6 11/12/2009 0000   LDLCALC 79 11/12/2009 0000      Wt Readings from Last 3 Encounters:  06/30/20 147 lb (66.7 kg)  04/24/20 147 lb 14.9 oz (67.1 kg)  03/17/20 148 lb (67.1 kg)      Other studies Reviewed: Additional studies/ records that were reviewed today include:  ED records Review of the above records demonstrates: See elsewhere   ASSESSMENT AND PLAN:  CAD:   The patient had negative ED work-up and no new symptoms.  She had nonobstructive disease previously.  The patient has no new sypmtoms.  She is quite active physically.  No further testing is suggested.  HTN:     This is not controlled.  She does not want to take the enalapril any longer because she thinks it is related to her cough.  I am going to switch her to Atacand.  She requests the lowest dose so we will start with 8 mg but she will keep a blood pressure diary and we might need to titrate this upward.   DYSLIPIDEMIA:   Her labs were as above.  I will repeat a fasting lipid profile liver enzymes today.    TOE PAIN: I will check a uric acid level  THYROID NODULE: I will order thyroid ultrasound and a TSH  Current medicines are reviewed at length with the patient today.  The patient does not have concerns regarding medicines.  The following changes have been made:  As aobve  Labs/ tests ordered today include:    Orders  Placed This Encounter  Procedures  . US Soft Tissue Head/Neck  . Lipid panel  . Hepatic function panel  . TSH  . Uric acid  . EKG 12-Lead     Disposition:   FU with me in 12 months.    Signed, Minus Breeding, MD  06/30/2020 12:09 PM    [facility]

## 2020-06-30 ENCOUNTER — Ambulatory Visit: Payer: Medicare PPO | Admitting: Podiatry

## 2020-06-30 ENCOUNTER — Ambulatory Visit (INDEPENDENT_AMBULATORY_CARE_PROVIDER_SITE_OTHER): Payer: Medicare PPO | Admitting: Cardiology

## 2020-06-30 ENCOUNTER — Ambulatory Visit (INDEPENDENT_AMBULATORY_CARE_PROVIDER_SITE_OTHER): Payer: Medicare PPO

## 2020-06-30 ENCOUNTER — Other Ambulatory Visit: Payer: Self-pay

## 2020-06-30 ENCOUNTER — Encounter: Payer: Self-pay | Admitting: Cardiology

## 2020-06-30 ENCOUNTER — Encounter: Payer: Self-pay | Admitting: Podiatry

## 2020-06-30 VITALS — BP 154/100 | HR 78 | Ht 65.0 in | Wt 147.0 lb

## 2020-06-30 DIAGNOSIS — Z79899 Other long term (current) drug therapy: Secondary | ICD-10-CM

## 2020-06-30 DIAGNOSIS — I10 Essential (primary) hypertension: Secondary | ICD-10-CM | POA: Diagnosis not present

## 2020-06-30 DIAGNOSIS — I2510 Atherosclerotic heart disease of native coronary artery without angina pectoris: Secondary | ICD-10-CM | POA: Diagnosis not present

## 2020-06-30 DIAGNOSIS — E785 Hyperlipidemia, unspecified: Secondary | ICD-10-CM

## 2020-06-30 DIAGNOSIS — E041 Nontoxic single thyroid nodule: Secondary | ICD-10-CM

## 2020-06-30 DIAGNOSIS — S92811A Other fracture of right foot, initial encounter for closed fracture: Secondary | ICD-10-CM

## 2020-06-30 DIAGNOSIS — M79676 Pain in unspecified toe(s): Secondary | ICD-10-CM | POA: Diagnosis not present

## 2020-06-30 MED ORDER — CANDESARTAN CILEXETIL 8 MG PO TABS: 8.0000 mg | ORAL_TABLET | Freq: Every day | ORAL | 3 refills | Status: DC

## 2020-06-30 NOTE — Patient Instructions (Signed)
Medication Instructions:  STOP LISINOPRIL START ATACAND (CANDESARTAN) 8MG  DAILY *If you need a refill on your cardiac medications before your next appointment, please call your pharmacy*  Lab Work: Your physician recommends that you return for lab work TODAY: (LIPID/ LIVER / URIC ACID/ TSH) If you have labs (blood work) drawn today and your tests are completely normal, you will receive your results only by: Marland Kitchen MyChart Message (if you have MyChart) OR . A paper copy in the mail If you have any lab test that is abnormal or we need to change your treatment, we will call you to review the results.  Testing/Procedures: THYROID ULTRASOUND  Follow-Up: At [facility], you and your health needs are our priority.  As part of our continuing mission to provide you with exceptional heart care, we have created designated Provider Care Teams.  These Care Teams include your primary Cardiologist (physician) and Advanced Practice Providers (APPs -  Physician Assistants and Nurse Practitioners) who all work together to provide you with the care you need, when you need it.  Your next appointment:   12 month(s)  You will receive a reminder letter in the mail two months in advance. If you don't receive a letter, please call our office to schedule the follow-up appointment.  The format for your next appointment:   In Person  Provider:   Minus Breeding, MD  Other Instructions CALL [phone] WITH HELP FINDING A NEW PRIMARY CARE PHYSICIAN.

## 2020-06-30 NOTE — Patient Instructions (Signed)
Sesamoid Injury  A sesamoid injury happens when a sesamoid bone or a surrounding tendon gets damaged during activity. A sesamoid bone is a bone that is connected to a tendon or a muscle, but not to a joint. There are sesamoid bones in your hands, knees, and feet. Your kneecap is an example of a sesamoid bone. Sesamoid injuries may include irritation, dislocation, or a break (fracture) in a sesamoid bone. The most common sesamoid injuries affect the sesamoid bones under the big toe. These bones help you move forward during weight-bearing activities. What are the causes? This condition is caused by damage to a sesamoid bone or a surrounding tendon. What increases the risk? This condition is more likely to develop in people who:  Dance and Architectural technologist.  Run.  Play sports.  Are active on artificial turf.  Wear high heels. What are the signs or symptoms? Symptoms of this condition include:  Pain in the affected hand, foot, or knee.  Pain when you try to straighten the affected toe or finger.  A popping sound that happens at the time of injury.  Swelling.  Bruising. How is this diagnosed? This condition is diagnosed with:  A physical exam.  Observation of your movement while you walk.  An X-ray.  Bone scans. How is this treated? Treatment for this condition depends on the location, type, and severity of the injury. Treatment may involve:  Resting the affected area and avoiding activities that are causing injury.  Applying ice to the affected area.  Taking over-the-counter pain medicine.  Placing a cushioned pad in the shoe of the affected foot.  Taping the affected finger or toe to prevent movement.  Getting steroid injections.  Wearing a cast, brace, or orthotic shoe.  Doing physical therapy. Surgery may be needed if other treatments do not work. Follow these instructions at home: If you have a cast:  Do not put pressure on any part of the cast until it is  fully hardened. This may take several hours.  Do not stick anything inside the cast to scratch your skin. Doing that increases your risk of infection.  Check the skin around it every day. Tell your health care provider about any concerns.  You may put lotion on dry skin around the edges of the cast. Do not put lotion on the skin underneath it.  Keep it clean and dry. If you have a brace or orthotic shoe:  Wear it as told by your health care provider. Remove it only as told by your health care provider.  Loosen it if your fingers or toes tingle, become numb, or turn cold and blue.  Keep it clean and dry. Bathing  Do not take baths, swim, or use a hot tub until your health care provider approves. Ask your health care provider if you may take showers. You may only be allowed to take sponge baths.  If the cast, brace, or orthotic shoe is not waterproof: ? Do not let it get wet. ? Cover it with a watertight covering when you take a bath or shower. Managing pain, stiffness, and swelling  If directed, put ice on the injured area. To do this: ? If you have a removable brace or orthotic shoe, remove it as told by your health care provider. ? Put ice in a plastic bag. ? Place a towel between your skin and the bag or between your cast and the bag. ? Leave the ice on for 20 minutes, 2-3 times a day.  Move your fingers or toes often to reduce stiffness and swelling.  Raise (elevate) the injured area above the level of your heart while you are sitting or lying down.   Driving  Ask your health care provider if the medicine prescribed to you requires you to avoid driving or using heavy machinery.  Ask your health care provider when it is safe to drive if you have a cast, brace, or orthotic shoe on a foot that you use for driving. Activity  Return to your normal activities as told by your health care provider. Ask your health care provider what activities are safe for you.  Do not use the  injured limb to support your body weight until your health care provider says that you can. Use crutches as told by your health care provider.  Do exercises as told by your health care provider or physical therapist. General instructions  Take over-the-counter and prescription medicines only as told by your health care provider.  Do not use any products that contain nicotine or tobacco, such as cigarettes, e-cigarettes, and chewing tobacco. If you need help quitting, ask your health care provider.  Keep all follow-up visits as told by your health care provider. This is important. Contact a health care provider if:  You have pain and swelling that continues, even with treatment.  Your pain and swelling returns after you get back to your normal activities.  You cannot put pressure on your foot. Get help right away if:  You lose sensation in the affected area.  Your fingers or toes turn cold and blue. Summary  A sesamoid injury happens when a sesamoid bone or a surrounding tendon gets damaged during activity.  Symptoms of this condition include pain in the affected area, a popping sound at the time of injury, swelling, and bruising.  Treatment for this condition depends on the location, type, and severity of the injury. This information is not intended to replace advice given to you by your health care provider. Make sure you discuss any questions you have with your health care provider. Document Revised: 08/29/2018 Document Reviewed: 07/19/2018 Elsevier Patient Education  [employer].

## 2020-06-30 NOTE — Progress Notes (Signed)
  Subjective:  Patient ID: [name], female    DOB: [birth year],  MRN: [id number]  Chief Complaint  Patient presents with  . Foot Pain    Right foot pain below big toe. PT stated that last week her toe was swollen and red and warm to the touch she stated that she had a poking pain.     80 y.o. female presents with the above complaint. History confirmed with patient. Started about a week ago. Swelling and redness has improved, pain still present.   Objective:  Physical Exam: warm, good capillary refill, no trophic changes or ulcerative lesions, normal DP and PT pulses and normal sensory exam.   Right Foot: pain on palpation sharply to the sesamoid complex  Radiographs: X-ray of the right foot: she has a transverse lucency of the tibial sesamoid Assessment:   1. Closed fracture of sesamoid bone of right foot, initial encounter      Plan:  Patient was evaluated and treated and all questions answered.  X-ray reviewed with patient. The history is somewhat consistent with gout but given resolution of most symptoms with sharp point point pain to the tibial sesamoid which correlates with the lucency on the xray I believe this is a closed sesamoid fracture as opposed to a bipartite variant. Recommend 4 weeks WB in CAM boot which was dispensed  Return in about 4 weeks (around 07/28/2020).

## 2020-07-01 ENCOUNTER — Encounter: Payer: Self-pay | Admitting: Podiatry

## 2020-07-01 ENCOUNTER — Telehealth: Payer: Self-pay | Admitting: Cardiology

## 2020-07-01 LAB — HEPATIC FUNCTION PANEL
ALT: 14 [IU]/L (ref 0–32)
AST: 17 [IU]/L (ref 0–40)
Albumin: 4.8 g/dL — ABNORMAL HIGH (ref 3.7–4.7)
Alkaline Phosphatase: 54 [IU]/L (ref 44–121)
Bilirubin Total: 0.4 mg/dL (ref 0.0–1.2)
Bilirubin, Direct: 0.12 mg/dL (ref 0.00–0.40)
Total Protein: 7.3 g/dL (ref 6.0–8.5)

## 2020-07-01 LAB — LIPID PANEL
Chol/HDL Ratio: 4.8 ratio — ABNORMAL HIGH (ref 0.0–4.4)
Cholesterol, Total: 275 mg/dL — ABNORMAL HIGH (ref 100–199)
HDL: 57 mg/dL (ref >39)
LDL Chol Calc (NIH): 184 mg/dL — ABNORMAL HIGH (ref 0–99)
Triglycerides: 182 mg/dL — ABNORMAL HIGH (ref 0–149)
VLDL Cholesterol Cal: 34 mg/dL (ref 5–40)

## 2020-07-01 LAB — TSH: TSH: 1.34 u[IU]/mL (ref 0.450–4.500)

## 2020-07-01 LAB — URIC ACID: Uric Acid: 4.9 mg/dL (ref 3.1–7.9)

## 2020-07-01 NOTE — Telephone Encounter (Signed)
Spoke with patient regarding appointment scheduled Friday 07/11/20 at 10:30 am at [facility], [address]---arrival time is 10:15 am for check in.  Patient voiced her understanding.

## 2020-07-01 NOTE — Addendum Note (Signed)
Addended by: Sherrie Mustache on: 07/01/2020 10:07 AM   Modules accepted: Orders

## 2020-07-10 ENCOUNTER — Other Ambulatory Visit: Payer: Self-pay

## 2020-07-10 DIAGNOSIS — E782 Mixed hyperlipidemia: Secondary | ICD-10-CM

## 2020-07-10 NOTE — Progress Notes (Unsigned)
Referred to Dr. Debara Pickett for lipid clinic per Dr. Percival Spanish result note.

## 2020-07-11 ENCOUNTER — Other Ambulatory Visit: Payer: Self-pay

## 2020-07-11 ENCOUNTER — Ambulatory Visit
Admission: RE | Admit: 2020-07-11 | Discharge: 2020-07-11 | Disposition: A | Discharge status: Home / Self Care | Payer: Medicare PPO | Source: Ambulatory Visit | Attending: Cardiology | Admitting: Cardiology

## 2020-07-11 DIAGNOSIS — E041 Nontoxic single thyroid nodule: Secondary | ICD-10-CM | POA: Diagnosis not present

## 2020-07-13 DIAGNOSIS — E041 Nontoxic single thyroid nodule: Secondary | ICD-10-CM

## 2020-07-17 DIAGNOSIS — E041 Nontoxic single thyroid nodule: Secondary | ICD-10-CM

## 2020-07-18 DIAGNOSIS — E041 Nontoxic single thyroid nodule: Secondary | ICD-10-CM

## 2020-07-25 DIAGNOSIS — H04123 Dry eye syndrome of bilateral lacrimal glands: Secondary | ICD-10-CM | POA: Diagnosis not present

## 2020-07-25 DIAGNOSIS — H524 Presbyopia: Secondary | ICD-10-CM | POA: Diagnosis not present

## 2020-07-25 DIAGNOSIS — Z961 Presence of intraocular lens: Secondary | ICD-10-CM | POA: Diagnosis not present

## 2020-07-25 DIAGNOSIS — H40013 Open angle with borderline findings, low risk, bilateral: Secondary | ICD-10-CM | POA: Diagnosis not present

## 2020-07-28 ENCOUNTER — Other Ambulatory Visit: Payer: Self-pay | Admitting: Family Medicine

## 2020-07-28 ENCOUNTER — Other Ambulatory Visit: Payer: Self-pay

## 2020-07-28 ENCOUNTER — Ambulatory Visit: Payer: Medicare PPO | Admitting: Podiatry

## 2020-07-28 DIAGNOSIS — S92811A Other fracture of right foot, initial encounter for closed fracture: Secondary | ICD-10-CM | POA: Diagnosis not present

## 2020-07-28 DIAGNOSIS — Z1231 Encounter for screening mammogram for malignant neoplasm of breast: Secondary | ICD-10-CM

## 2020-07-28 NOTE — Patient Instructions (Signed)
Wear the boot until next visit

## 2020-07-29 DIAGNOSIS — E041 Nontoxic single thyroid nodule: Secondary | ICD-10-CM | POA: Diagnosis not present

## 2020-07-29 DIAGNOSIS — E042 Nontoxic multinodular goiter: Secondary | ICD-10-CM | POA: Diagnosis not present

## 2020-07-29 DIAGNOSIS — R499 Unspecified voice and resonance disorder: Secondary | ICD-10-CM | POA: Diagnosis not present

## 2020-07-29 DIAGNOSIS — D440 Neoplasm of uncertain behavior of thyroid gland: Secondary | ICD-10-CM | POA: Diagnosis not present

## 2020-07-31 ENCOUNTER — Encounter: Payer: Self-pay | Admitting: Podiatry

## 2020-07-31 NOTE — Progress Notes (Signed)
  Subjective:  Patient ID: [name], female    DOB: [birth year],  MRN: [id number]  Chief Complaint  Patient presents with  . Foot Pain    PT stated that she is still having some pain     80 y.o. female returns with the above complaint. History confirmed with patient.  The CAM boot was helpful, she is no longer wearing at this point.  She does wonder if this is something similar to rheumatoid arthritis or gout, she has swelling in the joints of her hands  Objective:  Physical Exam: warm, good capillary refill, no trophic changes or ulcerative lesions, normal DP and PT pulses and normal sensory exam.   Right Foot: pain on palpation sharply to the sesamoid complex, mild edema and erythema  Radiographs: X-ray of the right foot: she has a transverse lucency of the tibial sesamoid Assessment:   1. Closed fracture of sesamoid bone of right foot, initial encounter      Plan:  Patient was evaluated and treated and all questions answered.  I recommended for now she continued to be WBAT in the CAM boot.  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  Lab work for the above was printed and given to her and she will get this drawn before the next visit.  If not improving by next visit from a musculoskeletal standpoint will consider MRI  Return in about 4 weeks (around 08/25/2020).

## 2020-08-04 DIAGNOSIS — I10 Essential (primary) hypertension: Secondary | ICD-10-CM | POA: Diagnosis not present

## 2020-08-04 DIAGNOSIS — E041 Nontoxic single thyroid nodule: Secondary | ICD-10-CM | POA: Diagnosis not present

## 2020-08-04 DIAGNOSIS — R221 Localized swelling, mass and lump, neck: Secondary | ICD-10-CM | POA: Diagnosis not present

## 2020-08-20 MED ORDER — CANDESARTAN CILEXETIL 16 MG PO TABS: 16.0000 mg | ORAL_TABLET | Freq: Every day | ORAL | 3 refills | Status: DC

## 2020-08-25 ENCOUNTER — Ambulatory Visit: Payer: Medicare PPO | Admitting: Podiatry

## 2020-09-12 ENCOUNTER — Other Ambulatory Visit: Payer: Self-pay

## 2020-09-12 ENCOUNTER — Ambulatory Visit
Admission: RE | Admit: 2020-09-12 | Discharge: 2020-09-12 | Disposition: A | Discharge status: Home / Self Care | Payer: Medicare PPO | Source: Ambulatory Visit

## 2020-09-12 DIAGNOSIS — Z1231 Encounter for screening mammogram for malignant neoplasm of breast: Secondary | ICD-10-CM

## 2020-09-22 DIAGNOSIS — I2510 Atherosclerotic heart disease of native coronary artery without angina pectoris: Secondary | ICD-10-CM | POA: Diagnosis not present

## 2020-09-22 DIAGNOSIS — R0789 Other chest pain: Secondary | ICD-10-CM | POA: Diagnosis not present

## 2020-09-22 DIAGNOSIS — E782 Mixed hyperlipidemia: Secondary | ICD-10-CM | POA: Diagnosis not present

## 2020-09-22 DIAGNOSIS — R7303 Prediabetes: Secondary | ICD-10-CM | POA: Diagnosis not present

## 2020-09-22 DIAGNOSIS — I10 Essential (primary) hypertension: Secondary | ICD-10-CM | POA: Diagnosis not present

## 2020-09-22 DIAGNOSIS — Z6825 Body mass index (BMI) 25.0-25.9, adult: Secondary | ICD-10-CM | POA: Diagnosis not present

## 2020-10-01 ENCOUNTER — Encounter: Payer: Self-pay | Admitting: Internal Medicine

## 2020-10-01 ENCOUNTER — Ambulatory Visit: Payer: Medicare PPO | Admitting: Internal Medicine

## 2020-10-01 ENCOUNTER — Other Ambulatory Visit: Payer: Self-pay

## 2020-10-01 VITALS — BP 134/78 | HR 69 | Temp 98.8°F | Resp 18 | Ht 65.0 in | Wt 146.8 lb

## 2020-10-01 DIAGNOSIS — R590 Localized enlarged lymph nodes: Secondary | ICD-10-CM | POA: Diagnosis not present

## 2020-10-01 DIAGNOSIS — R7301 Impaired fasting glucose: Secondary | ICD-10-CM

## 2020-10-01 DIAGNOSIS — M2540 Effusion, unspecified joint: Secondary | ICD-10-CM | POA: Diagnosis not present

## 2020-10-01 DIAGNOSIS — I10 Essential (primary) hypertension: Secondary | ICD-10-CM

## 2020-10-01 DIAGNOSIS — E782 Mixed hyperlipidemia: Secondary | ICD-10-CM

## 2020-10-01 LAB — COMPREHENSIVE METABOLIC PANEL
ALT: 12 U/L (ref 0–35)
AST: 17 U/L (ref 0–37)
Albumin: 4.4 g/dL (ref 3.5–5.2)
Alkaline Phosphatase: 38 U/L — ABNORMAL LOW (ref 39–117)
BUN: 22 mg/dL (ref 6–23)
CO2: 27 mEq/L (ref 19–32)
Calcium: 9.5 mg/dL (ref 8.4–10.5)
Chloride: 104 mEq/L (ref 96–112)
Creatinine, Ser: 0.69 mg/dL (ref 0.40–1.20)
GFR: 83.58 mL/min (ref >60.00)
Glucose, Bld: 118 mg/dL — ABNORMAL HIGH (ref 70–99)
Potassium: 3.9 mEq/L (ref 3.5–5.1)
Sodium: 141 mEq/L (ref 135–145)
Total Bilirubin: 0.5 mg/dL (ref 0.2–1.2)
Total Protein: 7.3 g/dL (ref 6.0–8.3)

## 2020-10-01 LAB — LIPID PANEL
Cholesterol: 248 mg/dL — ABNORMAL HIGH (ref 0–200)
HDL: 52.1 mg/dL (ref >39.00)
NonHDL: 195.93
Total CHOL/HDL Ratio: 5
Triglycerides: 213 mg/dL — ABNORMAL HIGH (ref 0.0–149.0)
VLDL: 42.6 mg/dL — ABNORMAL HIGH (ref 0.0–40.0)

## 2020-10-01 LAB — TSH: TSH: 1.07 u[IU]/mL (ref 0.35–4.50)

## 2020-10-01 LAB — CBC
HCT: 40.4 % (ref 36.0–46.0)
Hemoglobin: 13.5 g/dL (ref 12.0–15.0)
MCHC: 33.5 g/dL (ref 30.0–36.0)
MCV: 88.8 fl (ref 78.0–100.0)
Platelets: 239 10*3/uL (ref 150.0–400.0)
RBC: 4.54 Mil/uL (ref 3.87–5.11)
RDW: 13.4 % (ref 11.5–15.5)
WBC: 3.8 10*3/uL — ABNORMAL LOW (ref 4.0–10.5)

## 2020-10-01 LAB — HEMOGLOBIN A1C: Hgb A1c MFr Bld: 6.2 % (ref 4.6–6.5)

## 2020-10-01 LAB — LDL CHOLESTEROL, DIRECT: Direct LDL: 141 mg/dL

## 2020-10-01 LAB — URIC ACID: Uric Acid, Serum: 4 mg/dL (ref 2.4–7.0)

## 2020-10-01 NOTE — Progress Notes (Signed)
   Subjective:   Patient ID: [name], female    DOB: 1941/01/28, 80 y.o.   MRN: [id number]  HPI The patient is a 80 YO female coming in new for several concerns including pain in her hands (concerned for rheumatoid arthritis, hurting for awhile, denies swelling in the joints, they did change size awhile ago but they do not go up or down) and pre-diabetes (needs follow up on this, not on medication), and concerns about a lymph node or lesion in the neck, her cardiologist ordered thyroid US which led to biopsy and she is now seeing [facility] for this, it did not see this area which is submandibular, she was referred to ENT but did not see reason to go as she is not having problems swallowing).   Review of Systems  Constitutional: Negative.   HENT: Negative.        Lump in neck  Eyes: Negative.   Respiratory: Negative for cough, chest tightness and shortness of breath.   Cardiovascular: Negative for chest pain, palpitations and leg swelling.  Gastrointestinal: Negative for abdominal distention, abdominal pain, constipation, diarrhea, nausea and vomiting.  Musculoskeletal: Positive for arthralgias and joint swelling.  Skin: Negative.   Neurological: Negative.   Psychiatric/Behavioral: Negative.     Objective:  Physical Exam Constitutional:      Appearance: She is well-developed.  HENT:     Head: Normocephalic and atraumatic.  Cardiovascular:     Rate and Rhythm: Normal rate and regular rhythm.  Pulmonary:     Effort: Pulmonary effort is normal. No respiratory distress.     Breath sounds: Normal breath sounds. No wheezing or rales.  Abdominal:     General: Bowel sounds are normal. There is no distension.     Palpations: Abdomen is soft.     Tenderness: There is no abdominal tenderness. There is no rebound.  Musculoskeletal:     Cervical back: Normal range of motion.     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  Neurological:     Mental Status: She is alert and oriented to person, place, and time.     Coordination: Coordination normal.     Vitals:   10/01/20 1002  BP: 134/78  Pulse: 69  Resp: 18  Temp: 98.8 F (37.1 C)  TempSrc: Oral  SpO2: 97%  Weight: 146 lb 12.8 oz (66.6 kg)  Height: 5\' 5"  (1.651 m)    This visit occurred during the SARS-CoV-2 public health emergency.  Safety protocols were in place, including screening questions prior to the visit, additional usage of staff PPE, and extensive cleaning of exam room while observing appropriate contact time as indicated for disinfecting solutions.   Assessment & Plan:

## 2020-10-01 NOTE — Patient Instructions (Signed)
We will check the blood work today and the ultrasound of the neck to check the gland or lymph node.

## 2020-10-03 DIAGNOSIS — R590 Localized enlarged lymph nodes: Secondary | ICD-10-CM | POA: Insufficient documentation

## 2020-10-03 DIAGNOSIS — M2540 Effusion, unspecified joint: Secondary | ICD-10-CM | POA: Insufficient documentation

## 2020-10-03 LAB — ANA, IFA COMPREHENSIVE PANEL
Anti Nuclear Antibody (ANA): NEGATIVE
ENA SM Ab Ser-aCnc: <1 AI
SM/RNP: <1 AI
SSA (Ro) (ENA) Antibody, IgG: <1 AI
SSB (La) (ENA) Antibody, IgG: <1 AI
Scleroderma (Scl-70) (ENA) Antibody, IgG: <1 AI
ds DNA Ab: <1 IU/mL

## 2020-10-03 LAB — RHEUMATOID FACTOR: Rheumatoid fact SerPl-aCnc: <14 IU/mL (ref <14)

## 2020-10-03 NOTE — Assessment & Plan Note (Signed)
Checking lipid panel fasting.

## 2020-10-03 NOTE — Assessment & Plan Note (Signed)
Ordered US neck (non-thyroid) to assess and treat as appropriate. Never smoker.

## 2020-10-03 NOTE — Assessment & Plan Note (Signed)
Checking HgA1c and adjust as needed. Counseled to work on diet and exercise to avoid progression to diabetes.

## 2020-10-03 NOTE — Assessment & Plan Note (Signed)
Ordered uric acid, RF and ANA to rule out autoimmune cause.

## 2020-10-03 NOTE — Assessment & Plan Note (Signed)
Checking CMP and CBC. BP at goal on enalapril. Adjust as needed.

## 2020-10-06 ENCOUNTER — Encounter: Payer: Self-pay | Admitting: Internal Medicine

## 2020-10-06 ENCOUNTER — Other Ambulatory Visit (INDEPENDENT_AMBULATORY_CARE_PROVIDER_SITE_OTHER): Payer: Medicare PPO

## 2020-10-06 ENCOUNTER — Other Ambulatory Visit: Payer: Self-pay

## 2020-10-06 DIAGNOSIS — I10 Essential (primary) hypertension: Secondary | ICD-10-CM

## 2020-10-06 DIAGNOSIS — E782 Mixed hyperlipidemia: Secondary | ICD-10-CM

## 2020-10-06 LAB — LIPID PANEL
Cholesterol: 218 mg/dL — ABNORMAL HIGH (ref 0–200)
HDL: 44.7 mg/dL (ref >39.00)
LDL Cholesterol: 134 mg/dL — ABNORMAL HIGH (ref 0–99)
NonHDL: 173.45
Total CHOL/HDL Ratio: 5
Triglycerides: 197 mg/dL — ABNORMAL HIGH (ref 0.0–149.0)
VLDL: 39.4 mg/dL (ref 0.0–40.0)

## 2020-10-07 ENCOUNTER — Other Ambulatory Visit: Payer: Self-pay | Admitting: Internal Medicine

## 2020-10-07 DIAGNOSIS — E782 Mixed hyperlipidemia: Secondary | ICD-10-CM

## 2020-10-10 NOTE — Addendum Note (Signed)
Addended by: Pricilla Holm A on: 10/10/2020 03:13 PM   Modules accepted: Orders

## 2020-10-14 ENCOUNTER — Other Ambulatory Visit: Payer: Medicare PPO

## 2020-10-17 ENCOUNTER — Encounter: Payer: Self-pay | Admitting: Internal Medicine

## 2020-10-17 ENCOUNTER — Other Ambulatory Visit: Payer: Self-pay | Admitting: Internal Medicine

## 2020-10-17 MED ORDER — FENOFIBRATE 54 MG PO TABS: 54.0000 mg | ORAL_TABLET | ORAL | 3 refills | Status: DC

## 2020-10-18 ENCOUNTER — Encounter: Payer: Self-pay | Admitting: Internal Medicine

## 2020-10-20 ENCOUNTER — Encounter: Payer: Self-pay | Admitting: Internal Medicine

## 2020-10-21 ENCOUNTER — Ambulatory Visit
Admission: RE | Admit: 2020-10-21 | Discharge: 2020-10-21 | Disposition: A | Discharge status: Home / Self Care | Payer: Medicare PPO | Source: Ambulatory Visit | Attending: Internal Medicine | Admitting: Internal Medicine

## 2020-10-21 DIAGNOSIS — R221 Localized swelling, mass and lump, neck: Secondary | ICD-10-CM | POA: Diagnosis not present

## 2020-10-21 DIAGNOSIS — R590 Localized enlarged lymph nodes: Secondary | ICD-10-CM

## 2020-10-23 ENCOUNTER — Encounter: Payer: Self-pay | Admitting: Internal Medicine

## 2020-11-07 ENCOUNTER — Encounter: Payer: Self-pay | Admitting: Internal Medicine

## 2020-11-07 DIAGNOSIS — Z1211 Encounter for screening for malignant neoplasm of colon: Secondary | ICD-10-CM

## 2020-11-18 ENCOUNTER — Ambulatory Visit (HOSPITAL_BASED_OUTPATIENT_CLINIC_OR_DEPARTMENT_OTHER): Payer: Medicare PPO | Admitting: Internal Medicine

## 2021-01-08 ENCOUNTER — Other Ambulatory Visit: Payer: Self-pay

## 2021-01-08 ENCOUNTER — Encounter: Payer: Medicare PPO | Admitting: Internal Medicine

## 2021-01-08 ENCOUNTER — Encounter: Payer: Self-pay | Admitting: Internal Medicine

## 2021-01-08 ENCOUNTER — Ambulatory Visit (INDEPENDENT_AMBULATORY_CARE_PROVIDER_SITE_OTHER): Payer: Medicare PPO | Admitting: Internal Medicine

## 2021-01-08 ENCOUNTER — Other Ambulatory Visit (INDEPENDENT_AMBULATORY_CARE_PROVIDER_SITE_OTHER): Payer: Medicare PPO

## 2021-01-08 VITALS — BP 134/88 | HR 70 | Temp 98.6°F | Resp 18 | Ht 65.0 in | Wt 142.8 lb

## 2021-01-08 DIAGNOSIS — R7301 Impaired fasting glucose: Secondary | ICD-10-CM

## 2021-01-08 DIAGNOSIS — I10 Essential (primary) hypertension: Secondary | ICD-10-CM | POA: Diagnosis not present

## 2021-01-08 DIAGNOSIS — Z0000 Encounter for general adult medical examination without abnormal findings: Secondary | ICD-10-CM

## 2021-01-08 DIAGNOSIS — E782 Mixed hyperlipidemia: Secondary | ICD-10-CM | POA: Diagnosis not present

## 2021-01-08 DIAGNOSIS — Z1211 Encounter for screening for malignant neoplasm of colon: Secondary | ICD-10-CM

## 2021-01-08 LAB — CBC
HCT: 37.4 % (ref 36.0–46.0)
Hemoglobin: 12.3 g/dL (ref 12.0–15.0)
MCHC: 33 g/dL (ref 30.0–36.0)
MCV: 89.7 fl (ref 78.0–100.0)
Platelets: 233 10*3/uL (ref 150.0–400.0)
RBC: 4.17 Mil/uL (ref 3.87–5.11)
RDW: 13.9 % (ref 11.5–15.5)
WBC: 4.2 10*3/uL (ref 4.0–10.5)

## 2021-01-08 LAB — COMPREHENSIVE METABOLIC PANEL
ALT: 13 U/L (ref 0–35)
AST: 19 U/L (ref 0–37)
Albumin: 4 g/dL (ref 3.5–5.2)
Alkaline Phosphatase: 38 U/L — ABNORMAL LOW (ref 39–117)
BUN: 20 mg/dL (ref 6–23)
CO2: 30 mEq/L (ref 19–32)
Calcium: 9.3 mg/dL (ref 8.4–10.5)
Chloride: 103 mEq/L (ref 96–112)
Creatinine, Ser: 0.74 mg/dL (ref 0.40–1.20)
GFR: 77.77 mL/min (ref >60.00)
Glucose, Bld: 88 mg/dL (ref 70–99)
Potassium: 4.3 mEq/L (ref 3.5–5.1)
Sodium: 141 mEq/L (ref 135–145)
Total Bilirubin: 0.6 mg/dL (ref 0.2–1.2)
Total Protein: 6.8 g/dL (ref 6.0–8.3)

## 2021-01-08 LAB — LIPID PANEL
Cholesterol: 241 mg/dL — ABNORMAL HIGH (ref 0–200)
HDL: 53.8 mg/dL (ref >39.00)
LDL Cholesterol: 158 mg/dL — ABNORMAL HIGH (ref 0–99)
NonHDL: 187.12
Total CHOL/HDL Ratio: 4
Triglycerides: 144 mg/dL (ref 0.0–149.0)
VLDL: 28.8 mg/dL (ref 0.0–40.0)

## 2021-01-08 LAB — HEMOGLOBIN A1C: Hgb A1c MFr Bld: 6.3 % (ref 4.6–6.5)

## 2021-01-08 MED ORDER — CANDESARTAN CILEXETIL 4 MG PO TABS: 4.0000 mg | ORAL_TABLET | Freq: Every day | ORAL | 1 refills | Status: DC

## 2021-01-08 MED ORDER — CANDESARTAN CILEXETIL 16 MG PO TABS: 16.0000 mg | ORAL_TABLET | Freq: Every day | ORAL | 1 refills | Status: DC

## 2021-01-08 NOTE — Progress Notes (Signed)
   Subjective:   Patient ID: [name], female    DOB: [birth year], 80 y.o.   MRN: [id number]  HPI The patient is a 80 YO female coming in for physical.   PMH, [place], social history reviewed and updated  Review of Systems  Constitutional: Negative.   HENT: Negative.    Eyes: Negative.   Respiratory:  Negative for cough, chest tightness and shortness of breath.   Cardiovascular:  Negative for chest pain, palpitations and leg swelling.  Gastrointestinal:  Negative for abdominal distention, abdominal pain, constipation, diarrhea, nausea and vomiting.  Musculoskeletal: Negative.   Skin: Negative.   Neurological: Negative.   Psychiatric/Behavioral: Negative.     Objective:  Physical Exam Constitutional:      Appearance: She is well-developed.  HENT:     Head: Normocephalic and atraumatic.     Comments: Hair thinning Cardiovascular:     Rate and Rhythm: Normal rate and regular rhythm.  Pulmonary:     Effort: Pulmonary effort is normal. No respiratory distress.     Breath sounds: Normal breath sounds. No wheezing or rales.  Abdominal:     General: Bowel sounds are normal. There is no distension.     Palpations: Abdomen is soft.     Tenderness: There is no abdominal tenderness. There is no rebound.  Musculoskeletal:     Cervical back: Normal range of motion.  Skin:    General: Skin is warm and dry.  Neurological:     Mental Status: She is alert and oriented to person, place, and time.     Coordination: Coordination normal.    Vitals:   01/08/21 0954  BP: 134/88  Pulse: 70  Resp: 18  Temp: 98.6 F (37 C)  TempSrc: Oral  SpO2: 98%  Weight: 142 lb 12.8 oz (64.8 kg)  Height: '5\' 5"'$  (1.651 m)    This visit occurred during the SARS-CoV-2 public health emergency.  Safety protocols were in place, including screening questions prior to the visit, additional usage of staff PPE, and extensive cleaning of exam room while observing appropriate contact time as indicated for  disinfecting solutions.   Assessment & Plan:

## 2021-01-08 NOTE — Patient Instructions (Addendum)
We will send in the 4 mg and 16 mg candesartan to take total dose of 20 mg daily.   We have reordered the colon cancer screening so if this does not come let us know.  We will recheck the cholesterol in 3 months and check some other labs as well.

## 2021-01-09 DIAGNOSIS — Z0000 Encounter for general adult medical examination without abnormal findings: Secondary | ICD-10-CM | POA: Insufficient documentation

## 2021-01-09 NOTE — Assessment & Plan Note (Signed)
Flu shot yearly. Covid-19 up to date. Pneumonia complete. Shingrix complete. Tetanus counseled. Cologuard ordered. Mammogram up to date, pap smear aged out and dexa current. Counseled about sun safety and mole surveillance. Counseled about the dangers of distracted driving. Given 10 year screening recommendations.

## 2021-01-09 NOTE — Assessment & Plan Note (Signed)
Checking HgA1c and adjust as needed.  

## 2021-01-09 NOTE — Assessment & Plan Note (Signed)
Checking lipid panel and adjust as needed. She is intolerant to statins.

## 2021-01-09 NOTE — Assessment & Plan Note (Signed)
Checking CMP and adjust as needed.  

## 2021-01-20 DIAGNOSIS — Z6825 Body mass index (BMI) 25.0-25.9, adult: Secondary | ICD-10-CM | POA: Diagnosis not present

## 2021-01-20 DIAGNOSIS — N760 Acute vaginitis: Secondary | ICD-10-CM | POA: Diagnosis not present

## 2021-01-20 DIAGNOSIS — Z124 Encounter for screening for malignant neoplasm of cervix: Secondary | ICD-10-CM | POA: Diagnosis not present

## 2021-01-26 DIAGNOSIS — Z1211 Encounter for screening for malignant neoplasm of colon: Secondary | ICD-10-CM | POA: Diagnosis not present

## 2021-01-30 LAB — COLOGUARD: Cologuard: NEGATIVE

## 2021-02-09 DIAGNOSIS — H1132 Conjunctival hemorrhage, left eye: Secondary | ICD-10-CM | POA: Diagnosis not present

## 2021-03-02 ENCOUNTER — Other Ambulatory Visit: Payer: Self-pay

## 2021-03-02 ENCOUNTER — Ambulatory Visit: Payer: Medicare PPO | Admitting: Internal Medicine

## 2021-03-02 DIAGNOSIS — F40243 Fear of flying: Secondary | ICD-10-CM

## 2021-03-02 DIAGNOSIS — I10 Essential (primary) hypertension: Secondary | ICD-10-CM | POA: Diagnosis not present

## 2021-03-02 MED ORDER — CANDESARTAN CILEXETIL 4 MG PO TABS: 4.0000 mg | ORAL_TABLET | Freq: Two times a day (BID) | ORAL | 3 refills | Status: DC

## 2021-03-02 MED ORDER — ALPRAZOLAM 0.25 MG PO TABS: 0.2500 mg | ORAL_TABLET | Freq: Every day | ORAL | 0 refills | Status: DC | PRN

## 2021-03-02 NOTE — Patient Instructions (Signed)
I have updated the prescription for candesartan.  I have sent in xanax to use if needed 0.25 mg for travel if you need it.

## 2021-03-02 NOTE — Progress Notes (Signed)
   Subjective:   Patient ID: [name], female    DOB: 1941/01/16, 80 y.o.   MRN: [id number]  HPI The patient is a 80 YO female coming in for follow up blood pressure and other concerns.   Review of Systems  Constitutional: Negative.   HENT: Negative.    Eyes: Negative.   Respiratory:  Negative for cough, chest tightness and shortness of breath.   Cardiovascular:  Negative for chest pain, palpitations and leg swelling.  Gastrointestinal:  Negative for abdominal distention, abdominal pain, constipation, diarrhea, nausea and vomiting.  Musculoskeletal: Negative.   Skin: Negative.   Neurological: Negative.   Psychiatric/Behavioral: Negative.     Objective:  Physical Exam Constitutional:      Appearance: She is well-developed.  HENT:     Head: Normocephalic and atraumatic.  Cardiovascular:     Rate and Rhythm: Normal rate and regular rhythm.  Pulmonary:     Effort: Pulmonary effort is normal. No respiratory distress.     Breath sounds: Normal breath sounds. No wheezing or rales.  Abdominal:     General: Bowel sounds are normal. There is no distension.     Palpations: Abdomen is soft.     Tenderness: There is no abdominal tenderness. There is no rebound.  Musculoskeletal:     Cervical back: Normal range of motion.  Skin:    General: Skin is warm and dry.  Neurological:     Mental Status: She is alert and oriented to person, place, and time.     Coordination: Coordination normal.    Vitals:   03/02/21 1521  BP: 140/80  Pulse: 79  Resp: 18  SpO2: 98%  Weight: 146 lb (66.2 kg)  Height: 5\' 5"  (1.651 m)    This visit occurred during the SARS-CoV-2 public health emergency.  Safety protocols were in place, including screening questions prior to the visit, additional usage of staff PPE, and extensive cleaning of exam room while observing appropriate contact time as indicated for disinfecting solutions.   Assessment & Plan:  Visit time 20 minutes in face to face  communication with patient and coordination of care, additional 10 minutes spent in record review, coordination or care, ordering tests, communicating/referring to other healthcare professionals, documenting in medical records all on the same day of the visit for total time 30 minutes spent on the visit.

## 2021-03-04 ENCOUNTER — Encounter: Payer: Self-pay | Admitting: Internal Medicine

## 2021-03-04 NOTE — Assessment & Plan Note (Signed)
She is planning trip to [place] later this year and would like small amount of medication to take with her for anxiety while traveling. Rx done today for xanax 0.25 mg daily prn anxiety.

## 2021-03-04 NOTE — Assessment & Plan Note (Signed)
BP has been fluctuating and not at goal at home. BP today is acceptable. She previously was taking candesartan 20 mg in the morning and is now taking prn 4 mg candesartan additional in the evening. We will update prescription as this seems reasonable and is still within maximum dosing guidelines.

## 2021-04-11 ENCOUNTER — Other Ambulatory Visit: Payer: Self-pay | Admitting: Internal Medicine

## 2021-04-17 ENCOUNTER — Ambulatory Visit: Payer: Medicare PPO | Admitting: Cardiology

## 2021-04-22 ENCOUNTER — Encounter: Payer: Self-pay | Admitting: Internal Medicine

## 2021-04-22 ENCOUNTER — Other Ambulatory Visit (INDEPENDENT_AMBULATORY_CARE_PROVIDER_SITE_OTHER): Payer: Medicare PPO

## 2021-04-22 ENCOUNTER — Other Ambulatory Visit: Payer: Self-pay

## 2021-04-22 ENCOUNTER — Other Ambulatory Visit: Payer: Self-pay | Admitting: Internal Medicine

## 2021-04-22 DIAGNOSIS — R7301 Impaired fasting glucose: Secondary | ICD-10-CM | POA: Diagnosis not present

## 2021-04-22 DIAGNOSIS — E782 Mixed hyperlipidemia: Secondary | ICD-10-CM | POA: Diagnosis not present

## 2021-04-22 LAB — LIPID PANEL
Cholesterol: 238 mg/dL — ABNORMAL HIGH (ref 0–200)
HDL: 59.9 mg/dL (ref >39.00)
LDL Cholesterol: 155 mg/dL — ABNORMAL HIGH (ref 0–99)
NonHDL: 178.08
Total CHOL/HDL Ratio: 4
Triglycerides: 116 mg/dL (ref 0.0–149.0)
VLDL: 23.2 mg/dL (ref 0.0–40.0)

## 2021-04-22 LAB — COMPREHENSIVE METABOLIC PANEL
ALT: 14 U/L (ref 0–35)
AST: 22 U/L (ref 0–37)
Albumin: 4.1 g/dL (ref 3.5–5.2)
Alkaline Phosphatase: 43 U/L (ref 39–117)
BUN: 21 mg/dL (ref 6–23)
CO2: 32 mEq/L (ref 19–32)
Calcium: 9.1 mg/dL (ref 8.4–10.5)
Chloride: 102 mEq/L (ref 96–112)
Creatinine, Ser: 0.64 mg/dL (ref 0.40–1.20)
GFR: 84.78 mL/min (ref >60.00)
Glucose, Bld: 96 mg/dL (ref 70–99)
Potassium: 4.8 mEq/L (ref 3.5–5.1)
Sodium: 138 mEq/L (ref 135–145)
Total Bilirubin: 0.5 mg/dL (ref 0.2–1.2)
Total Protein: 6.9 g/dL (ref 6.0–8.3)

## 2021-04-22 LAB — HEMOGLOBIN A1C: Hgb A1c MFr Bld: 6.2 % (ref 4.6–6.5)

## 2021-05-03 NOTE — Progress Notes (Signed)
Cardiology Office Note   Date:  05/05/2021   ID:  Janice, [name] April 11, 1941, MRN [id number]  PCP:  Hoyt Koch, MD  Cardiologist:   Minus Breeding, MD  Referring:  Hoyt Koch, MD  Chief Complaint  Patient presents with   Chest Pain     History of Present Illness: [name] is a 80 y.o. female who presents for follow up of CAD.  In 2003 she had nonobstructive CAD with PDA 50% stenosis, diagonal 30% stenosis.  She had a negative POET ([employer]ain [employer] Exercise Treadmill) in 2017.    Since I saw her she has had some chest discomfort.  This is a midsternal discomfort.  Happens when she is tired where she is stressed.  It pressure like sensation.  She does not describe jaw or arm discomfort.  She does not have associated nausea vomiting or diaphoresis.  She does not report any palpitations, presyncope or syncope.  She did have some episodes of her eyelid swelling for 1 week.  She reduced her salt.  This has been intermittent since then.  She is not however having any new shortness of breath, PND or orthopnea.  She has had no weight gain or edema.   Past Medical History:  Diagnosis Date   Arthritis    GERD (gastroesophageal reflux disease)    High cholesterol    Hypertension    Osteoporosis     Past Surgical History:  Procedure Laterality Date   ABDOMINAL HYSTERECTOMY     BREAST EXCISIONAL BIOPSY Right      Current Outpatient Medications  Medication Sig Dispense Refill   aspirin 81 MG tablet Take 81 mg by mouth daily.     [START ON 05/06/2021] atorvastatin (LIPITOR) 10 MG tablet Take 0.5 tablets (5 mg total) by mouth 3 (three) times a week. 18 tablet 3   candesartan (ATACAND) 16 MG tablet Take 1 tablet (16 mg total) by mouth daily. Take with 4 mg daily for total daily dosing 20 mg 90 tablet 1   candesartan (ATACAND) 4 MG tablet Take 1 tablet (4 mg total) by mouth in the morning and at bedtime. Take with 16 mg for total daily dose 24 mg 180 tablet 3    Cholecalciferol 25 MCG (1000 UT) tablet Take 2,000 Units by mouth daily.     fenofibrate 54 MG tablet Take 1 tablet (54 mg total) by mouth 3 (three) times a week. 50 tablet 3   fish oil-omega-3 fatty acids 1000 MG capsule Take 1 g by mouth daily.     Magnesium 200 MG TABS Take 400 mg by mouth daily.     Multiple Vitamin (MULITIVITAMIN WITH MINERALS) TABS Take 1 tablet by mouth daily.     No current facility-administered medications for this visit.    Allergies:   Crestor [rosuvastatin], Lipitor [atorvastatin], Other, Epinephrine, Ioxaglate, and Ivp dye [iodinated diagnostic agents]     ROS:  Please see the history of present illness.   Otherwise, review of systems are positive for none.   All other systems are reviewed and negative.    PHYSICAL EXAM: VS:  BP (!) 160/75    Pulse 73    Ht 5\' 5"  (1.651 m)    Wt 148 lb 9.6 oz (67.4 kg)    SpO2 99%    BMI 24.73 kg/m  , BMI Body mass index is 24.73 kg/m. GENERAL:  Well appearing NECK:  No jugular venous distention, waveform within normal limits, carotid upstroke brisk  and symmetric, no bruits, no thyromegaly LUNGS:  Clear to auscultation bilaterally CHEST:  Unremarkable HEART:  PMI not displaced or sustained,S1 and S2 within normal limits, no S3, no S4, no clicks, no rubs, no murmurs ABD:  Flat, positive bowel sounds normal in frequency in pitch, no bruits, no rebound, no guarding, no midline pulsatile mass, no hepatomegaly, no splenomegaly EXT:  2 plus pulses throughout, no edema, no cyanosis no clubbing   EKG:  EKG is   ordered today. The ekg ordered today demonstrates normal sinus rhythm, rate 73, axis within normal limits, intervals within normal limits, no acute ST-T wave change.   Recent Labs: 10/01/2020: TSH 1.07 01/08/2021: Hemoglobin 12.3; Platelets 233.0 04/22/2021: ALT 14; BUN 21; Creatinine, Ser 0.64; Potassium 4.8; Sodium 138    Lipid Panel    Component Value Date/Time   CHOL 238 (H) 04/22/2021 1117   CHOL 275 (H)  06/30/2020 1201   TRIG 116.0 04/22/2021 1117   HDL 59.90 04/22/2021 1117   HDL 57 06/30/2020 1201   CHOLHDL 4 04/22/2021 1117   VLDL 23.2 04/22/2021 1117   LDLCALC 155 (H) 04/22/2021 1117   LDLCALC 184 (H) 06/30/2020 1201   LDLDIRECT 141.0 10/01/2020 1034      Wt Readings from Last 3 Encounters:  05/05/21 148 lb 9.6 oz (67.4 kg)  03/02/21 146 lb (66.2 kg)  01/08/21 142 lb 12.8 oz (64.8 kg)      Other studies Reviewed: Additional studies/ records that were reviewed today include:  Labs Review of the above records demonstrates: See elsewhere   ASSESSMENT AND PLAN:  CAD:   She does have some chest discomfort.  I would be ordering a CT scan to look for obstructive coronary disease given this though I think the pretest probability is probably moderate rather than high.  However, given her contrast allergy I am going to go ahead and do a [employer].   HTN:     This is controlled with the regimen that she takes though somewhat complicated.  No change in therapy.   I do note that it is elevated today but she says it is controlled at home and she watches it closely.  DYSLIPIDEMIA:   Her labs were with an LDL of 155.  She has been intolerant of statins.  However, she would like to try a low-dose of Lipitor 5 mg 3 times a week and then we can repeat a lipid profile in 3 months.    THYROID NODULE:   She had biopsy at [facility].  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   Current medicines are reviewed at length with the patient today.  The patient does not have concerns regarding medicines.  The following changes have been made: As above  Labs/ tests ordered today include:    Orders Placed This Encounter  Procedures   Lipid panel   MYOCARDIAL PERFUSION IMAGING   EKG 12-Lead     Disposition:   FU with me in 12 months.    Signed, Minus Breeding, MD  05/05/2021 5:53 PM    [place]

## 2021-05-05 ENCOUNTER — Encounter: Payer: Self-pay | Admitting: Cardiology

## 2021-05-05 ENCOUNTER — Other Ambulatory Visit: Payer: Self-pay

## 2021-05-05 ENCOUNTER — Ambulatory Visit: Payer: Medicare PPO | Admitting: Cardiology

## 2021-05-05 VITALS — BP 160/75 | HR 73 | Ht 65.0 in | Wt 148.6 lb

## 2021-05-05 DIAGNOSIS — I10 Essential (primary) hypertension: Secondary | ICD-10-CM | POA: Diagnosis not present

## 2021-05-05 DIAGNOSIS — E785 Hyperlipidemia, unspecified: Secondary | ICD-10-CM | POA: Diagnosis not present

## 2021-05-05 DIAGNOSIS — I2510 Atherosclerotic heart disease of native coronary artery without angina pectoris: Secondary | ICD-10-CM

## 2021-05-05 DIAGNOSIS — R072 Precordial pain: Secondary | ICD-10-CM | POA: Diagnosis not present

## 2021-05-05 MED ORDER — ATORVASTATIN CALCIUM 10 MG PO TABS: 5.0000 mg | ORAL_TABLET | ORAL | 3 refills | Status: DC

## 2021-05-05 NOTE — Patient Instructions (Signed)
Medication Instructions:  Take Lipitor one half tablet (5 mg) three times a week.  *If you need a refill on your cardiac medications before your next appointment, please call your pharmacy*   Lab Work: In 3 months (fasting lipids) If you have labs (blood work) drawn today and your tests are completely normal, you will receive your results only by: [place] (if you have MyChart) OR A paper copy in the mail If you have any lab test that is abnormal or we need to change your treatment, we will call you to review the results.   Testing/Procedures: Your physician has requested that you have en exercise stress myoview. For further information please visit [email or web address]. Please follow instruction sheet, as given.   Follow-Up: At [facility], you and your health needs are our priority.  As part of our continuing mission to provide you with exceptional heart care, we have created designated Provider Care Teams.  These Care Teams include your primary Cardiologist (physician) and Advanced Practice Providers (APPs -  Physician Assistants and Nurse Practitioners) who all work together to provide you with the care you need, when you need it.  We recommend signing up for the patient portal called "MyChart".  Sign up information is provided on this After Visit Summary.  MyChart is used to connect with patients for Virtual Visits (Telemedicine).  Patients are able to view lab/test results, encounter notes, upcoming appointments, etc.  Non-urgent messages can be sent to your provider as well.   To learn more about what you can do with MyChart, go to [email or web address].    Your next appointment:   12 month(s)  The format for your next appointment:   In Person  Provider:   Dr. Vita Barley. MD

## 2021-05-14 ENCOUNTER — Telehealth (HOSPITAL_COMMUNITY): Payer: Self-pay | Admitting: *Deleted

## 2021-05-14 NOTE — Telephone Encounter (Signed)
Close encounter 

## 2021-05-15 ENCOUNTER — Encounter (HOSPITAL_COMMUNITY): Payer: Self-pay | Admitting: *Deleted

## 2021-05-15 ENCOUNTER — Ambulatory Visit (HOSPITAL_COMMUNITY)
Admission: RE | Admit: 2021-05-15 | Payer: Medicare PPO | Source: Ambulatory Visit | Attending: Cardiology | Admitting: Cardiology

## 2021-05-15 NOTE — Progress Notes (Signed)
Pt came today for her Myoview study, but wanted to reschedule her test because she wasn't feeling well. She states she felt like her BP was high and had a pressure type headache. She took her BP at home around noon and it was 198/96. Discussed this with Dr Debara Pickett ([facility]) and he was okay w/ pt rescheduling her study. Ok d/c pt home and she was instructed to rest and monitor her BP at home. If her BP remained high, she was to call her PCP or go to an Urgent Care for evaluation. Pt verbalized understanding and she will reschedule her Myoview study next week.

## 2021-06-02 ENCOUNTER — Encounter: Payer: Self-pay | Admitting: Internal Medicine

## 2021-06-02 ENCOUNTER — Encounter: Payer: Self-pay | Admitting: Cardiology

## 2021-06-04 ENCOUNTER — Telehealth (HOSPITAL_COMMUNITY): Payer: Self-pay | Admitting: Cardiology

## 2021-06-04 NOTE — Telephone Encounter (Signed)
Patient called and cancelled Myoview for the reason below:  06/04/2021 2:18 PM GA:[id number], ANGELINE S  Cancel Rsn: Patient (pt wanted to wait when the weather is a little bit warmer, she said she will cb to r/s) 05/15/2021 3:51 PM OC:AREQJEAD, PAMELA A  Cancel Rsn: Patient (Pt not feeling well.)  Order will be removed from the active [place] and when patient calls back to reschedule we will reinstate the order.

## 2021-06-05 ENCOUNTER — Encounter: Payer: Self-pay | Admitting: Internal Medicine

## 2021-06-05 ENCOUNTER — Ambulatory Visit: Payer: Medicare PPO | Admitting: Internal Medicine

## 2021-06-05 ENCOUNTER — Other Ambulatory Visit: Payer: Self-pay

## 2021-06-05 DIAGNOSIS — E782 Mixed hyperlipidemia: Secondary | ICD-10-CM

## 2021-06-05 DIAGNOSIS — R7301 Impaired fasting glucose: Secondary | ICD-10-CM

## 2021-06-05 DIAGNOSIS — I10 Essential (primary) hypertension: Secondary | ICD-10-CM | POA: Diagnosis not present

## 2021-06-05 MED ORDER — CLONIDINE HCL 0.1 MG PO TABS: 0.1000 mg | ORAL_TABLET | Freq: Every day | ORAL | 3 refills | Status: DC | PRN

## 2021-06-05 NOTE — Progress Notes (Signed)
° °  Subjective:   Patient ID: [name], female    DOB: [birth year], 81 y.o.   MRN: [id number]  HPI The patient is a 81 YO female coming in for blood pressure concerns and follow up.  Review of Systems  Constitutional: Negative.   HENT: Negative.    Eyes: Negative.   Respiratory:  Negative for cough, chest tightness and shortness of breath.   Cardiovascular:  Negative for chest pain, palpitations and leg swelling.  Gastrointestinal:  Negative for abdominal distention, abdominal pain, constipation, diarrhea, nausea and vomiting.  Musculoskeletal: Negative.   Skin: Negative.   Neurological:  Positive for headaches.  Psychiatric/Behavioral: Negative.     Objective:  Physical Exam Constitutional:      Appearance: She is well-developed.  HENT:     Head: Normocephalic and atraumatic.  Cardiovascular:     Rate and Rhythm: Normal rate and regular rhythm.  Pulmonary:     Effort: Pulmonary effort is normal. No respiratory distress.     Breath sounds: Normal breath sounds. No wheezing or rales.  Abdominal:     General: Bowel sounds are normal. There is no distension.     Palpations: Abdomen is soft.     Tenderness: There is no abdominal tenderness. There is no rebound.  Musculoskeletal:     Cervical back: Normal range of motion.  Skin:    General: Skin is warm and dry.  Neurological:     Mental Status: She is alert and oriented to person, place, and time.     Coordination: Coordination normal.    Vitals:   06/05/21 1031  BP: 132/80  Pulse: 76  Resp: 18  SpO2: 98%  Weight: 148 lb 12.8 oz (67.5 kg)  Height: 5\' 5"  (1.651 m)    This visit occurred during the SARS-CoV-2 public health emergency.  Safety protocols were in place, including screening questions prior to the visit, additional usage of staff PPE, and extensive cleaning of exam room while observing appropriate contact time as indicated for disinfecting solutions.   Assessment & Plan:

## 2021-06-05 NOTE — Patient Instructions (Signed)
We will check the labs around mid March.

## 2021-06-05 NOTE — Assessment & Plan Note (Signed)
BP severely elevated when she went for stress testing and she was very nervous about this. BP stayed elevated for 2-3 days. Now is back to normal which is well controlled on candesartan 24 mg daily. Rx clonidine 0.1 mg prn systolic >754 or diastolic >237. Checking CMP and CBC at next lab draw in March.

## 2021-06-05 NOTE — Assessment & Plan Note (Signed)
She has agreed to try lipitor 5 mg three times a week. Started mid to late December and lipid panel and CMP ordered for March.

## 2021-06-05 NOTE — Assessment & Plan Note (Signed)
We will continue with monitoring HgA1c every 3 months and labs placed due March.

## 2021-06-08 NOTE — Telephone Encounter (Signed)
June 08, 2021 Minus Breeding, MD to Me      1:14 PM I called her and she would like to not have the stress test and she will call me back if her symptoms worsen.

## 2021-06-10 ENCOUNTER — Ambulatory Visit (HOSPITAL_COMMUNITY)
Admission: RE | Admit: 2021-06-10 | Payer: Medicare PPO | Source: Ambulatory Visit | Attending: Cardiology | Admitting: Cardiology

## 2021-07-11 DIAGNOSIS — Z03818 Encounter for observation for suspected exposure to other biological agents ruled out: Secondary | ICD-10-CM | POA: Diagnosis not present

## 2021-07-11 DIAGNOSIS — J029 Acute pharyngitis, unspecified: Secondary | ICD-10-CM | POA: Diagnosis not present

## 2021-07-11 DIAGNOSIS — R519 Headache, unspecified: Secondary | ICD-10-CM | POA: Diagnosis not present

## 2021-07-11 DIAGNOSIS — R058 Other specified cough: Secondary | ICD-10-CM | POA: Diagnosis not present

## 2021-07-13 DIAGNOSIS — J189 Pneumonia, unspecified organism: Secondary | ICD-10-CM | POA: Diagnosis not present

## 2021-07-16 ENCOUNTER — Ambulatory Visit: Payer: Medicare PPO | Admitting: Internal Medicine

## 2021-07-17 ENCOUNTER — Encounter: Payer: Self-pay | Admitting: Internal Medicine

## 2021-07-17 ENCOUNTER — Ambulatory Visit: Payer: Medicare PPO | Admitting: Internal Medicine

## 2021-07-17 ENCOUNTER — Other Ambulatory Visit: Payer: Self-pay

## 2021-07-17 VITALS — BP 160/80 | HR 73 | Temp 98.3°F | Ht 65.0 in | Wt 151.8 lb

## 2021-07-17 DIAGNOSIS — I10 Essential (primary) hypertension: Secondary | ICD-10-CM

## 2021-07-17 DIAGNOSIS — R0989 Other specified symptoms and signs involving the circulatory and respiratory systems: Secondary | ICD-10-CM

## 2021-07-17 LAB — POC COVID19 BINAXNOW: SARS Coronavirus 2 Ag: NEGATIVE

## 2021-07-17 MED ORDER — HYDROCODONE BIT-HOMATROP MBR 5-1.5 MG/5ML PO SOLN: 5.0000 mL | Freq: Four times a day (QID) | ORAL | 0 refills | Status: DC | PRN

## 2021-07-17 NOTE — Progress Notes (Signed)
? ? ?Subjective:  ? ? Patient ID: [name], female    DOB: 17-Jan-1941, 81 y.o.   MRN: [id number] ? ?This visit occurred during the SARS-CoV-2 public health emergency.  Safety protocols were in place, including screening questions prior to the visit, additional usage of staff PPE, and extensive cleaning of exam room while observing appropriate contact time as indicated for disinfecting solutions. ? ? ? ?HPI ?Kale is here for  ?Chief Complaint  ?Patient presents with  ? Cough  ?  Cough, patient feels weak and fatigue  ? ? ? ?Last Thursday cold symptoms started.  The symptoms were mild Thursday and Friday.  She took cold eeze.  Saturday she started to feel really bad.  She had STO, HA, achy.  She went to urgent care at [facility].  Covid, rsv, flu, strep tests were all negative.  She was advised rest and symptomatic treatment.  Sunday she felt worse.  Monday she called here and there was no appt until Thursday. She felt worse Monday after 5pm.  Her chest was congested, cough with brownish thick mucus, throat had white dots.  She went back to urgent care.  They could not do the covid test - the lab was closed.  She was prescribed doxycycline.  She took mucinex.  Two days later she was feeling better.  She cancelled the Thursday the appt. She started coughing a lot and her BP was elevated.   ? ? ?Whenever she is under a stressful situation her BP goes up high -- she uses clonidine if BP > 180/100.   ? ? ? ?Medications and allergies reviewed with patient and updated if appropriate. ? ?Current Outpatient Medications on File Prior to Visit  ?Medication Sig Dispense Refill  ? aspirin 81 MG tablet Take 81 mg by mouth daily.    ? atorvastatin (LIPITOR) 10 MG tablet Take 0.5 tablets (5 mg total) by mouth 3 (three) times a week. 18 tablet 3  ? candesartan (ATACAND) 4 MG tablet Take 1 tablet (4 mg total) by mouth in the morning and at bedtime. Take with 16 mg for total daily dose 24 mg 180 tablet 3  ? Cholecalciferol 25 MCG  (1000 UT) tablet Take 2,000 Units by mouth daily.    ? cloNIDine (CATAPRES) 0.1 MG tablet Take 1 tablet (0.1 mg total) by mouth daily as needed (if systolic BP >175 or diastolic >102). 90 tablet 3  ? fenofibrate 54 MG tablet Take 1 tablet (54 mg total) by mouth 3 (three) times a week. 50 tablet 3  ? fish oil-omega-3 fatty acids 1000 MG capsule Take 1 g by mouth daily.    ? Magnesium 200 MG TABS Take 400 mg by mouth daily.    ? Multiple Vitamin (MULITIVITAMIN WITH MINERALS) TABS Take 1 tablet by mouth daily.    ? ?No current facility-administered medications on file prior to visit.  ? ? ?Review of Systems  ?Constitutional:  Positive for fatigue. Negative for chills and fever.  ?HENT:  Positive for sore throat (much better). Negative for congestion, ear pain (resolved) and sinus pain.   ?Respiratory:  Positive for cough (a little better - mucus less thick and not brown). Negative for shortness of breath (resolved) and wheezing (resolved).   ?Gastrointestinal:  Negative for abdominal pain and nausea.  ?Neurological:  Positive for light-headedness and headaches.  ? ?   ?Objective:  ? ?Vitals:  ? 07/17/21 1540  ?BP: (!) 160/80  ?Pulse: 73  ?Temp: 98.3 ?F (36.8 ?C)  ?  SpO2: 98%  ? ?BP Readings from Last 3 Encounters:  ?07/17/21 (!) 160/80  ?06/05/21 132/80  ?05/05/21 (!) 160/75  ? ?Wt Readings from Last 3 Encounters:  ?07/17/21 151 lb 12.8 oz (68.9 kg)  ?06/05/21 148 lb 12.8 oz (67.5 kg)  ?05/05/21 148 lb 9.6 oz (67.4 kg)  ? ?Body mass index is 25.26 kg/m?. ? ?  ?Physical Exam ?Constitutional:   ?   General: She is not in acute distress. ?   Appearance: Normal appearance. She is not ill-appearing.  ?HENT:  ?   Head: Normocephalic and atraumatic.  ?   Right Ear: Tympanic membrane, ear canal and external ear normal.  ?   Left Ear: Tympanic membrane, ear canal and external ear normal.  ?   Mouth/Throat:  ?   Mouth: Mucous membranes are moist.  ?   Pharynx: No oropharyngeal exudate or posterior oropharyngeal erythema.  ?Eyes:   ?   Conjunctiva/sclera: Conjunctivae normal.  ?Cardiovascular:  ?   Rate and Rhythm: Normal rate and regular rhythm.  ?Pulmonary:  ?   Effort: Pulmonary effort is normal. No respiratory distress.  ?   Breath sounds: Normal breath sounds. No wheezing or rales.  ?Musculoskeletal:  ?   Cervical back: Neck supple. No tenderness.  ?Lymphadenopathy:  ?   Cervical: No cervical adenopathy.  ?Skin: ?   General: Skin is warm and dry.  ?Neurological:  ?   Mental Status: She is alert.  ? ?   ? ? ? ? ? ?Assessment & Plan:  ? ? ?Acute bronchitis ?Has been to urgent care and is on doxycycline for 10 days-she is feeling better and will complete the full course of doxycycline ?She is not sleeping well-we will prescribe Hycodan cough syrup ?Can take over-the-counter cold medications-advised Coricidin cold products which will not affect her blood pressure ?Increase fluids ?Expect energy level to improve as she continues to feel better ? ?Hypertension: ?Chronic ?Typically controlled, except when she is in a stressful situation or sick and then her blood pressure goes high ?PCP has given her clonidine 0.1 mg to take as needed for SBP more than 180, DBP more than 100 ?Advised that she can take while taking doxycycline ?She will continue the Atacand 4 mg twice daily ?Advised to check blood pressure when she gets home tonight and if BP is still elevated can take 0.05-0.1 mg of clonidine to see if that helps and continue as needed depending on the blood pressure readings ? ? ? ? ?

## 2021-07-17 NOTE — Addendum Note (Signed)
Addended by: Marcina Millard on: 07/17/2021 04:33 PM ? ? Modules accepted: Orders ? ?

## 2021-07-17 NOTE — Patient Instructions (Addendum)
? ? ? ?  Medications changes include :  hycodan cough syrup  ? ? ?Your prescription(s) have been sent to your pharmacy.  ? ? ? ? ?Return if symptoms worsen or fail to improve. ? ?

## 2021-07-21 DIAGNOSIS — E041 Nontoxic single thyroid nodule: Secondary | ICD-10-CM | POA: Diagnosis not present

## 2021-08-04 ENCOUNTER — Other Ambulatory Visit: Payer: Self-pay

## 2021-08-04 ENCOUNTER — Ambulatory Visit: Payer: Medicare PPO | Admitting: Internal Medicine

## 2021-08-04 ENCOUNTER — Encounter: Payer: Self-pay | Admitting: Internal Medicine

## 2021-08-04 VITALS — BP 160/80 | HR 83 | Resp 18 | Ht 65.0 in | Wt 149.8 lb

## 2021-08-04 DIAGNOSIS — E041 Nontoxic single thyroid nodule: Secondary | ICD-10-CM

## 2021-08-04 DIAGNOSIS — R7301 Impaired fasting glucose: Secondary | ICD-10-CM

## 2021-08-04 DIAGNOSIS — I10 Essential (primary) hypertension: Secondary | ICD-10-CM

## 2021-08-04 DIAGNOSIS — I2510 Atherosclerotic heart disease of native coronary artery without angina pectoris: Secondary | ICD-10-CM

## 2021-08-04 DIAGNOSIS — E782 Mixed hyperlipidemia: Secondary | ICD-10-CM

## 2021-08-04 MED ORDER — AMLODIPINE BESYLATE 2.5 MG PO TABS: 2.5000 mg | ORAL_TABLET | Freq: Every day | ORAL | 3 refills | Status: DC

## 2021-08-04 NOTE — Progress Notes (Signed)
? ?  Subjective:  ? ?Patient ID: [name], female    DOB: [birth year], 81 y.o.   MRN: [id number] ? ?HPI ?The patient is a 81 YO female coming in for follow up. ? ?Review of Systems  ?Constitutional: Negative.   ?HENT: Negative.    ?Eyes: Negative.   ?Respiratory:  Negative for cough, chest tightness and shortness of breath.   ?Cardiovascular:  Negative for chest pain, palpitations and leg swelling.  ?Gastrointestinal:  Negative for abdominal distention, abdominal pain, constipation, diarrhea, nausea and vomiting.  ?Musculoskeletal: Negative.   ?Skin: Negative.   ?Neurological: Negative.   ?Psychiatric/Behavioral: Negative.    ? ?Objective:  ?Physical Exam ?Constitutional:   ?   Appearance: She is well-developed.  ?HENT:  ?   Head: Normocephalic and atraumatic.  ?Cardiovascular:  ?   Rate and Rhythm: Normal rate and regular rhythm.  ?Pulmonary:  ?   Effort: Pulmonary effort is normal. No respiratory distress.  ?   Breath sounds: Normal breath sounds. No wheezing or rales.  ?Abdominal:  ?   General: Bowel sounds are normal. There is no distension.  ?   Palpations: Abdomen is soft.  ?   Tenderness: There is no abdominal tenderness. There is no rebound.  ?Musculoskeletal:  ?   Cervical back: Normal range of motion.  ?Skin: ?   General: Skin is warm and dry.  ?Neurological:  ?   Mental Status: She is alert and oriented to person, place, and time.  ?   Coordination: Coordination normal.  ? ? ?Vitals:  ? 08/04/21 1413  ?BP: (!) 160/80  ?Pulse: 83  ?Resp: 18  ?SpO2: 99%  ?Weight: 149 lb 12.8 oz (67.9 kg)  ?Height: '5\' 5"'$  (1.651 m)  ? ? ?This visit occurred during the SARS-CoV-2 public health emergency.  Safety protocols were in place, including screening questions prior to the visit, additional usage of staff PPE, and extensive cleaning of exam room while observing appropriate contact time as indicated for disinfecting solutions.  ? ?Assessment & Plan:  ? ?

## 2021-08-04 NOTE — Patient Instructions (Addendum)
We will check the labs tomorrow. ? ?We have sent in amlodipine 2.5 mg daily for blood pressure. ?

## 2021-08-05 LAB — T4, FREE: Free T4: 0.78 ng/dL (ref 0.60–1.60)

## 2021-08-05 LAB — CBC
HCT: 38 % (ref 36.0–46.0)
Hemoglobin: 12.7 g/dL (ref 12.0–15.0)
MCHC: 33.4 g/dL (ref 30.0–36.0)
MCV: 89.8 fl (ref 78.0–100.0)
Platelets: 232 10*3/uL (ref 150.0–400.0)
RBC: 4.23 Mil/uL (ref 3.87–5.11)
RDW: 13.6 % (ref 11.5–15.5)
WBC: 4.2 10*3/uL (ref 4.0–10.5)

## 2021-08-05 LAB — LIPID PANEL
Cholesterol: 189 mg/dL (ref 0–200)
HDL: 57.3 mg/dL (ref >39.00)
LDL Cholesterol: 102 mg/dL — ABNORMAL HIGH (ref 0–99)
NonHDL: 132.15
Total CHOL/HDL Ratio: 3
Triglycerides: 152 mg/dL — ABNORMAL HIGH (ref 0.0–149.0)
VLDL: 30.4 mg/dL (ref 0.0–40.0)

## 2021-08-05 LAB — COMPREHENSIVE METABOLIC PANEL
ALT: 13 U/L (ref 0–35)
AST: 20 U/L (ref 0–37)
Albumin: 4.4 g/dL (ref 3.5–5.2)
Alkaline Phosphatase: 40 U/L (ref 39–117)
BUN: 23 mg/dL (ref 6–23)
CO2: 31 mEq/L (ref 19–32)
Calcium: 9.5 mg/dL (ref 8.4–10.5)
Chloride: 103 mEq/L (ref 96–112)
Creatinine, Ser: 0.73 mg/dL (ref 0.40–1.20)
GFR: 78.73 mL/min (ref >60.00)
Glucose, Bld: 99 mg/dL (ref 70–99)
Potassium: 4.6 mEq/L (ref 3.5–5.1)
Sodium: 140 mEq/L (ref 135–145)
Total Bilirubin: 0.7 mg/dL (ref 0.2–1.2)
Total Protein: 7 g/dL (ref 6.0–8.3)

## 2021-08-05 LAB — HEMOGLOBIN A1C: Hgb A1c MFr Bld: 6.4 % (ref 4.6–6.5)

## 2021-08-05 LAB — TSH: TSH: 1.26 u[IU]/mL (ref 0.35–5.50)

## 2021-08-07 NOTE — Assessment & Plan Note (Signed)
Taking aspirin 81 mg daily and is on statin. We are working on better BP control to help lower CV risk long term. Goal BP <130/80 ideal.  ?

## 2021-08-07 NOTE — Assessment & Plan Note (Addendum)
BP is elevated intermittently systolic readings from home and today. Average 140s/70s at home. This is above goal and we would like to be aggressive with BP control. She has healthy lifestyle so will need additional medications. Add amlodipine 2.5 mg daily continue candesartan 24 mg total daily dose. Continue clonidine 0.1 mg (1/2 to 1 pill) as needed for systolic >801 or diastolic >65. Checking CMP, CBC, lipid panel for follow up and adjust plan as needed. ?

## 2021-08-07 NOTE — Assessment & Plan Note (Signed)
Checking HgA1c and adjust as needed. She has healthy lifestyle.  ?

## 2021-08-07 NOTE — Assessment & Plan Note (Signed)
She is taking lipitor 10 mg 3 times a week since December so checking lipid panel today and adjust dosing as needed.  ?

## 2021-08-11 ENCOUNTER — Other Ambulatory Visit: Payer: Self-pay | Admitting: Family Medicine

## 2021-08-11 ENCOUNTER — Other Ambulatory Visit: Payer: Self-pay | Admitting: Internal Medicine

## 2021-08-11 DIAGNOSIS — Z1231 Encounter for screening mammogram for malignant neoplasm of breast: Secondary | ICD-10-CM

## 2021-08-13 ENCOUNTER — Encounter: Payer: Self-pay | Admitting: Internal Medicine

## 2021-08-27 DIAGNOSIS — B029 Zoster without complications: Secondary | ICD-10-CM | POA: Diagnosis not present

## 2021-09-06 IMAGING — US US THYROID
1 series · 13 of 25 positions shown · non-contrast
Comparison: None.

CLINICAL DATA: Thyroid nodule, palpable submandibular lymph nodes

EXAM:
THYROID ULTRASOUND
TECHNIQUE: Ultrasound examination of the thyroid gland and adjacent soft
tissues was performed.

[Series 1: us thyroid · 0.05mm/px · 13 of 75 slices shown]
[im 1/75]
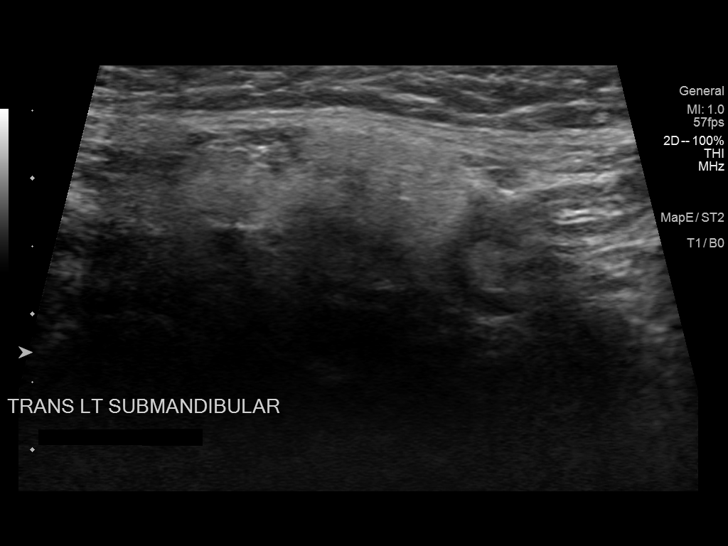
[im 7/75]
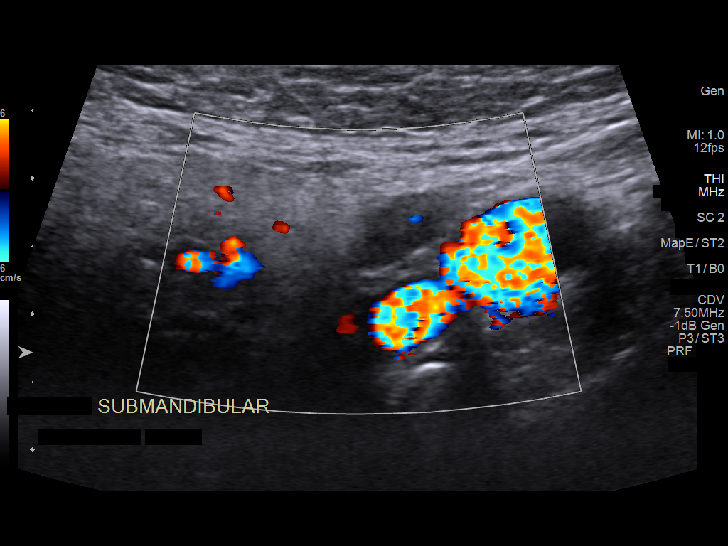
[im 13/75]
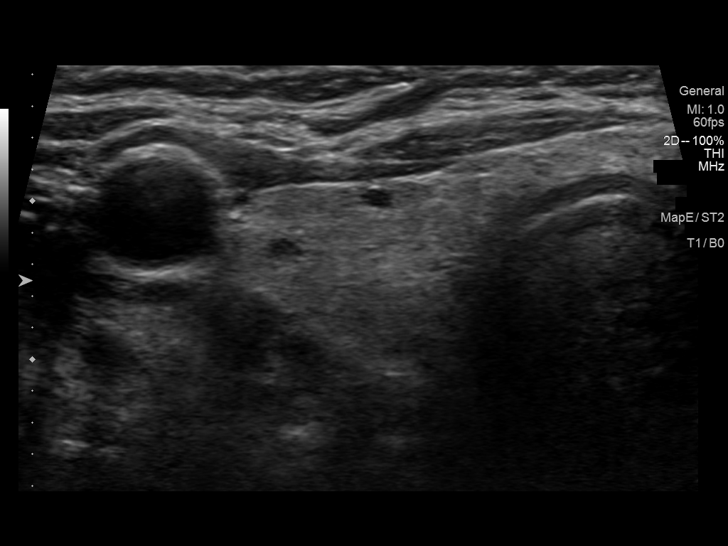
[im 19/75]
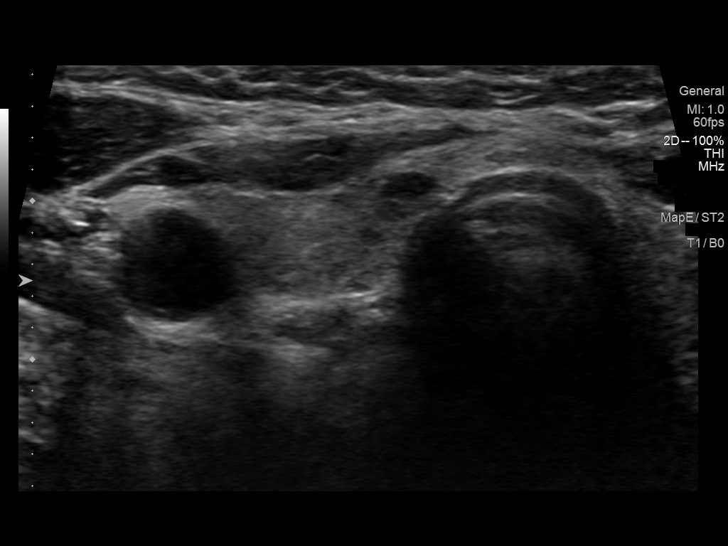
[im 25/75]
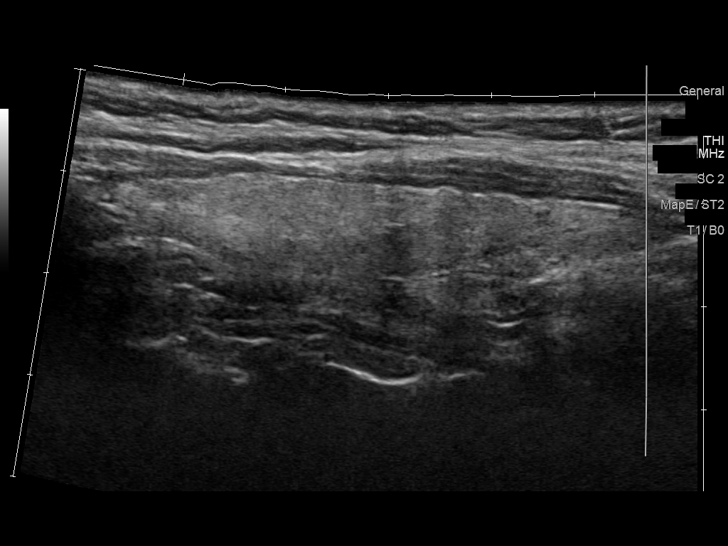
[im 31/75]
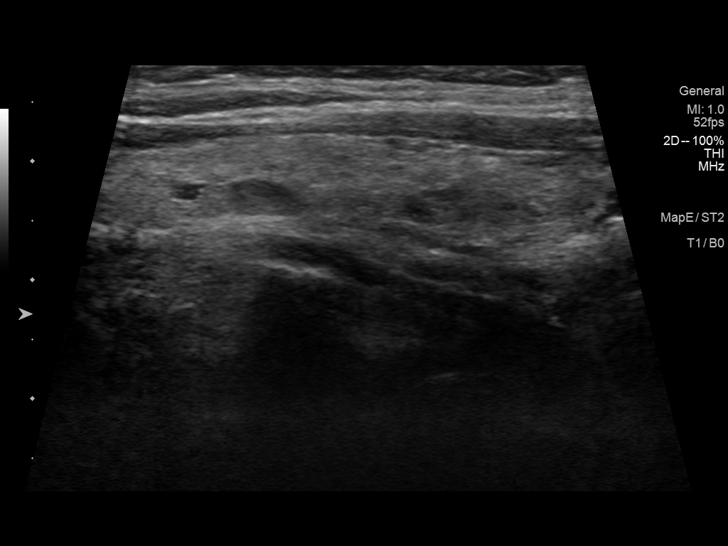
[im 38/75]
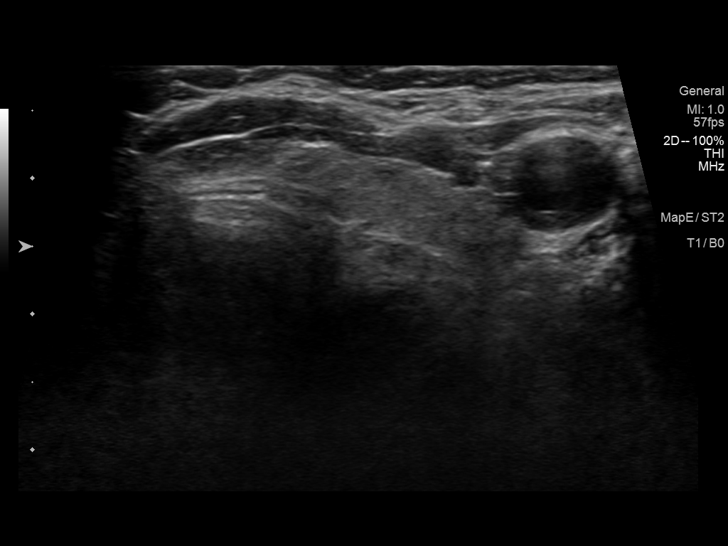
[im 44/75]
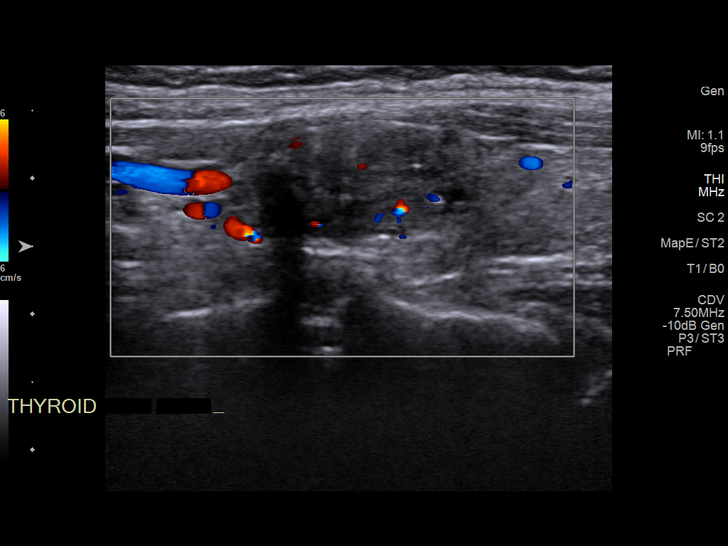
[im 50/75]
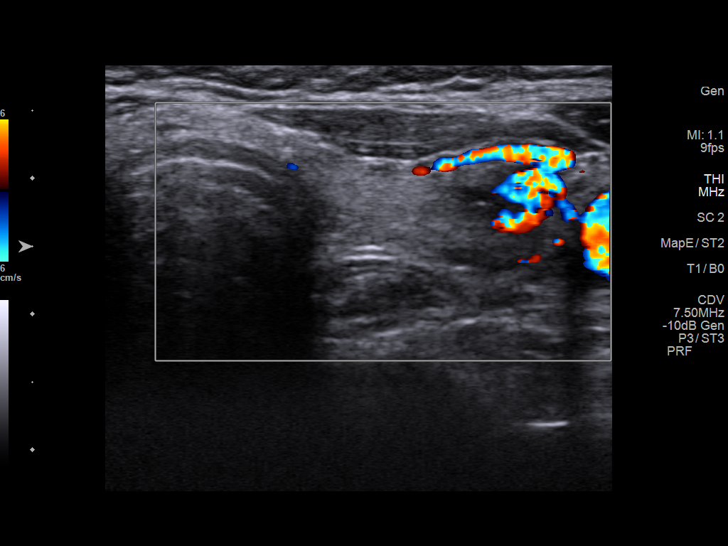
[im 56/75]
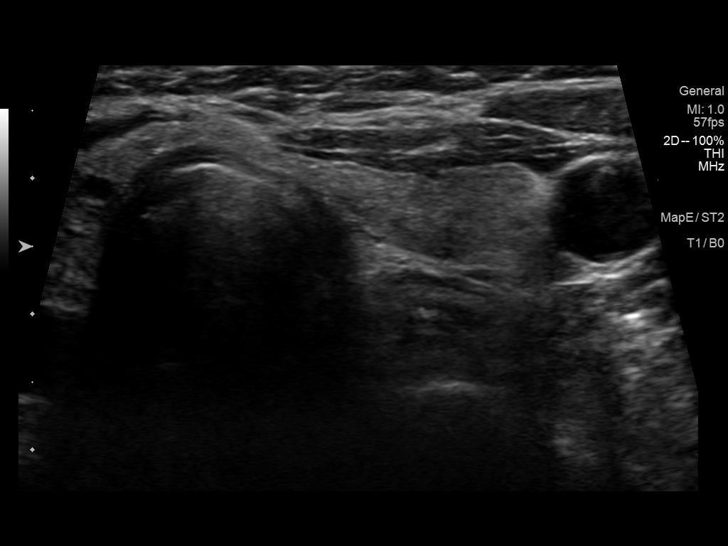
[im 62/75]
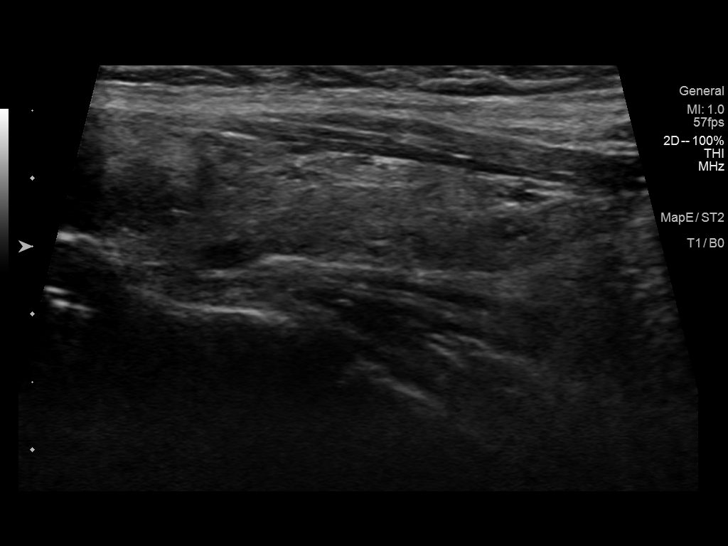
[im 68/75]
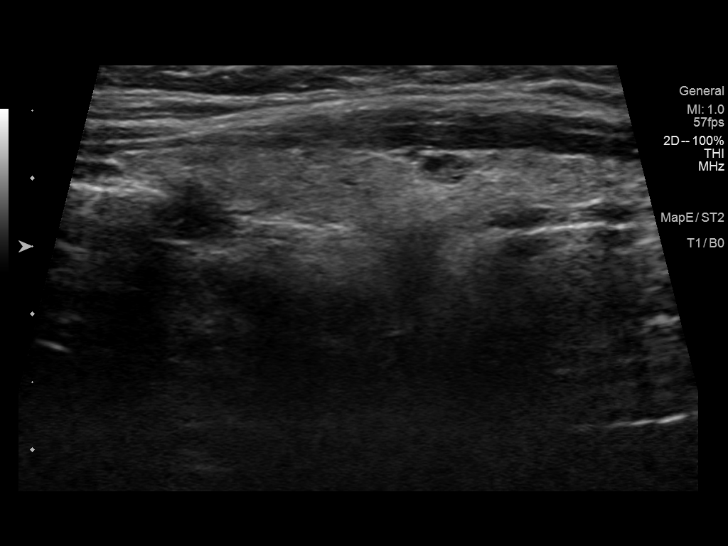
[im 75/75]
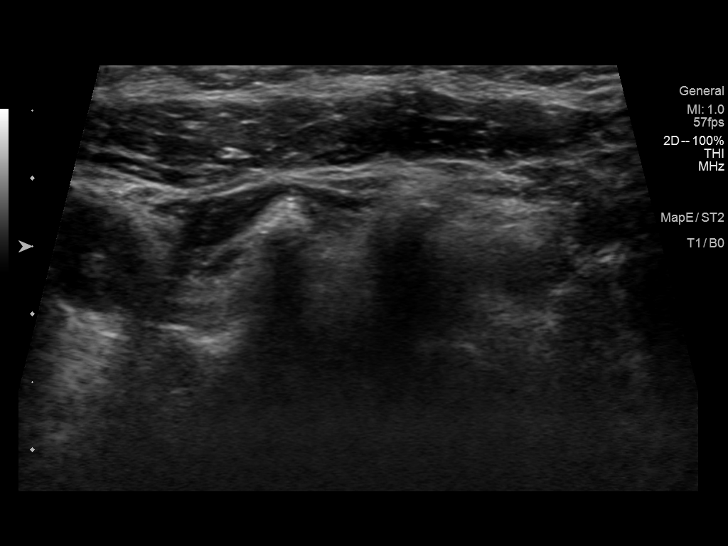

[13 of 25 positions shown; findings below may reference images not displayed]

FINDINGS: Parenchymal Echotexture: Mildly heterogenous

Isthmus: 3 mm

Right lobe: 5.5 x 1.0 x 1.5 cm

Left lobe: 5.2 x 1.2 x 1.8 cm

_________________________________________________________

Estimated total number of nodules >/= 1 cm: 1

Number of spongiform nodules >/=  2 cm not described below (TR1): 0

Number of mixed cystic and solid nodules >/= 1.5 cm not described
below (TR2): 0

_________________________________________________________

Nodule # 2:

Location: Left; Mid

Maximum size: 1.9 cm; Other 2 dimensions: 1.2 x 0.9 cm

Composition: solid/almost completely solid (2)

Echogenicity: hypoechoic (2)

Shape: not taller-than-wide (0)

Margins: ill-defined (0)

Echogenic foci: punctate echogenic foci (3)

ACR TI-RADS total points: 7.

ACR TI-RADS risk category: TR5 (>/= 7 points).

ACR TI-RADS recommendations:

**Given size (>/= 1.0 cm) and appearance, fine needle aspiration of
this highly suspicious nodule should be considered based on TI-RADS
criteria.

_________________________________________________________

There are additional scattered bilateral subcentimeter hypoechoic
nodules noted, all measuring 6 mm or less in size. These would not
meet criteria for any biopsy or follow-up and are not fully
described by TI rads criteria.

Left submandibular palpable nodule correlates with a
benign-appearing adjacent lymph node measuring 6 mm in short axis
with preserved architecture.

No bulky adenopathy.
IMPRESSION: 1.9 cm left mid thyroid TR 5 nodule meets criteria for biopsy as
above.

The above is in keeping with the ACR TI-RADS recommendations - [HOSPITAL] 4818;[DATE].

## 2021-09-14 ENCOUNTER — Ambulatory Visit
Admission: RE | Admit: 2021-09-14 | Discharge: 2021-09-14 | Disposition: A | Discharge status: Home / Self Care | Payer: Medicare PPO | Source: Ambulatory Visit | Attending: Internal Medicine | Admitting: Internal Medicine

## 2021-09-14 DIAGNOSIS — Z1231 Encounter for screening mammogram for malignant neoplasm of breast: Secondary | ICD-10-CM

## 2021-10-07 DIAGNOSIS — E042 Nontoxic multinodular goiter: Secondary | ICD-10-CM | POA: Diagnosis not present

## 2021-10-07 DIAGNOSIS — E041 Nontoxic single thyroid nodule: Secondary | ICD-10-CM | POA: Diagnosis not present

## 2021-10-22 DIAGNOSIS — E041 Nontoxic single thyroid nodule: Secondary | ICD-10-CM | POA: Diagnosis not present

## 2021-11-06 DIAGNOSIS — N760 Acute vaginitis: Secondary | ICD-10-CM | POA: Diagnosis not present

## 2021-11-12 DIAGNOSIS — H04123 Dry eye syndrome of bilateral lacrimal glands: Secondary | ICD-10-CM | POA: Diagnosis not present

## 2021-11-12 DIAGNOSIS — H524 Presbyopia: Secondary | ICD-10-CM | POA: Diagnosis not present

## 2021-11-12 DIAGNOSIS — Z961 Presence of intraocular lens: Secondary | ICD-10-CM | POA: Diagnosis not present

## 2021-11-12 DIAGNOSIS — H5211 Myopia, right eye: Secondary | ICD-10-CM | POA: Diagnosis not present

## 2021-11-13 DIAGNOSIS — R351 Nocturia: Secondary | ICD-10-CM | POA: Diagnosis not present

## 2021-11-18 DIAGNOSIS — N200 Calculus of kidney: Secondary | ICD-10-CM | POA: Diagnosis not present

## 2021-11-27 ENCOUNTER — Other Ambulatory Visit: Payer: Self-pay | Admitting: Internal Medicine

## 2021-12-09 ENCOUNTER — Other Ambulatory Visit: Payer: Self-pay | Admitting: Internal Medicine

## 2021-12-17 IMAGING — US US SOFT TISSUE HEAD/NECK
1 series · 7 of 7 positions shown · non-contrast
Comparison: None.

CLINICAL DATA: Palpable left submandibular lesion. Question
adenopathy.

EXAM:
ULTRASOUND OF HEAD/NECK SOFT TISSUES
TECHNIQUE: Ultrasound examination of the head and neck soft tissues was
performed in the area of clinical concern.

[Series 1: us soft tissue head/neck · 0.06mm/px · 7 of 7 slices shown]
[im 1/7]
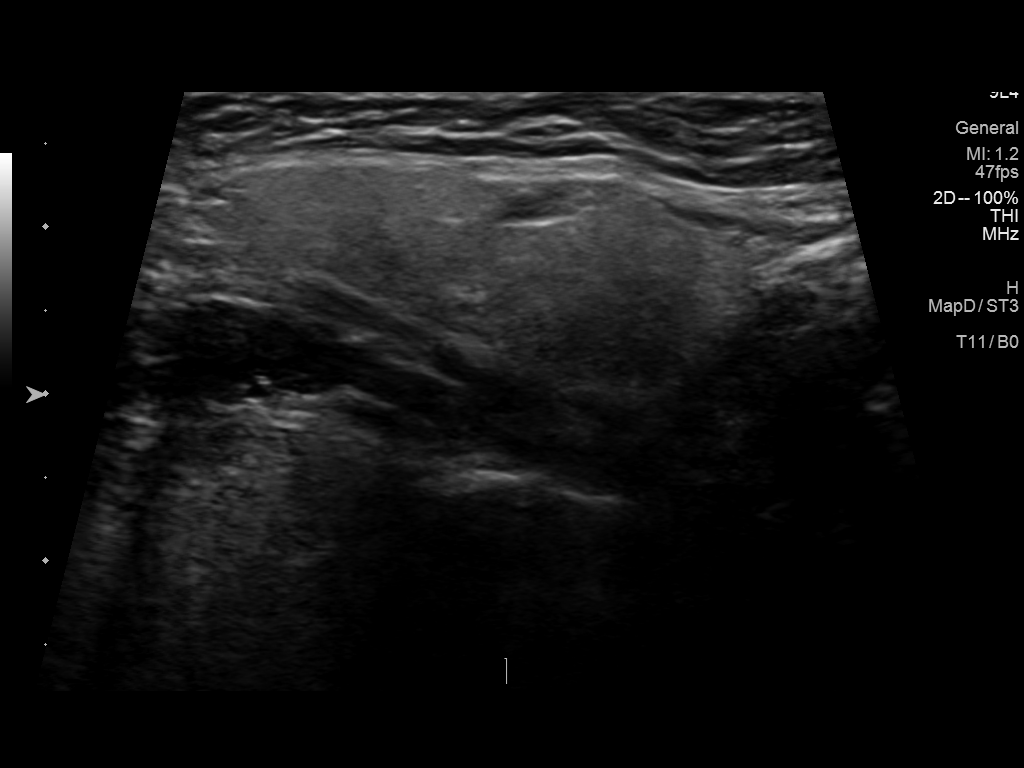
[im 2/7]
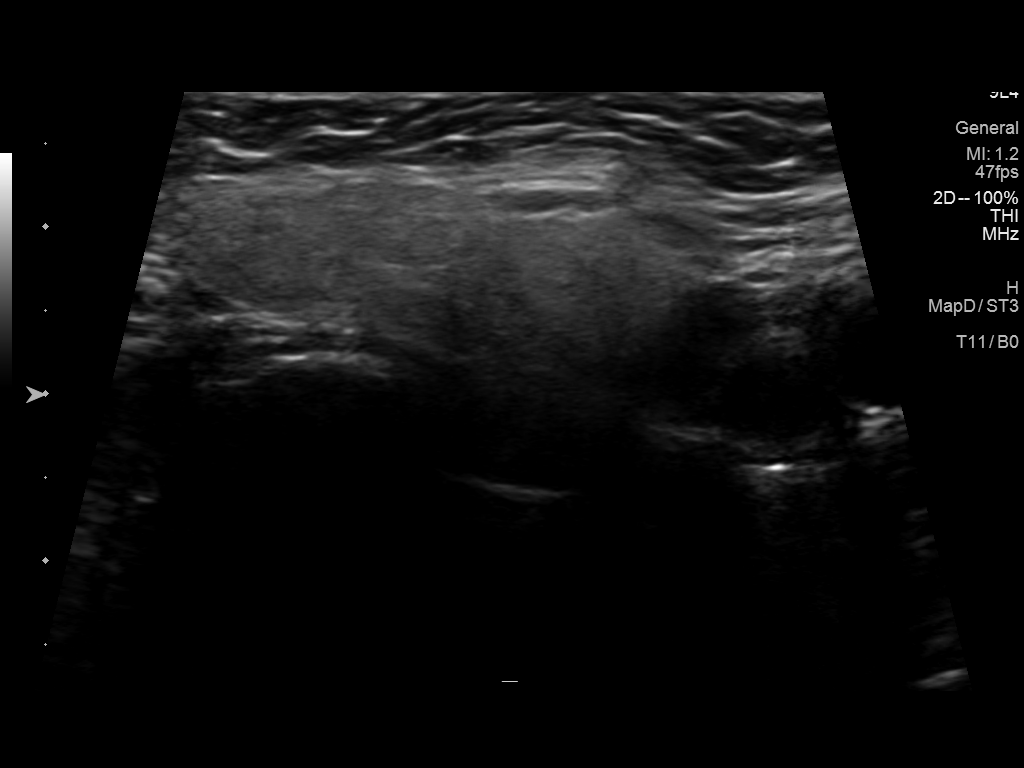
[im 3/7]
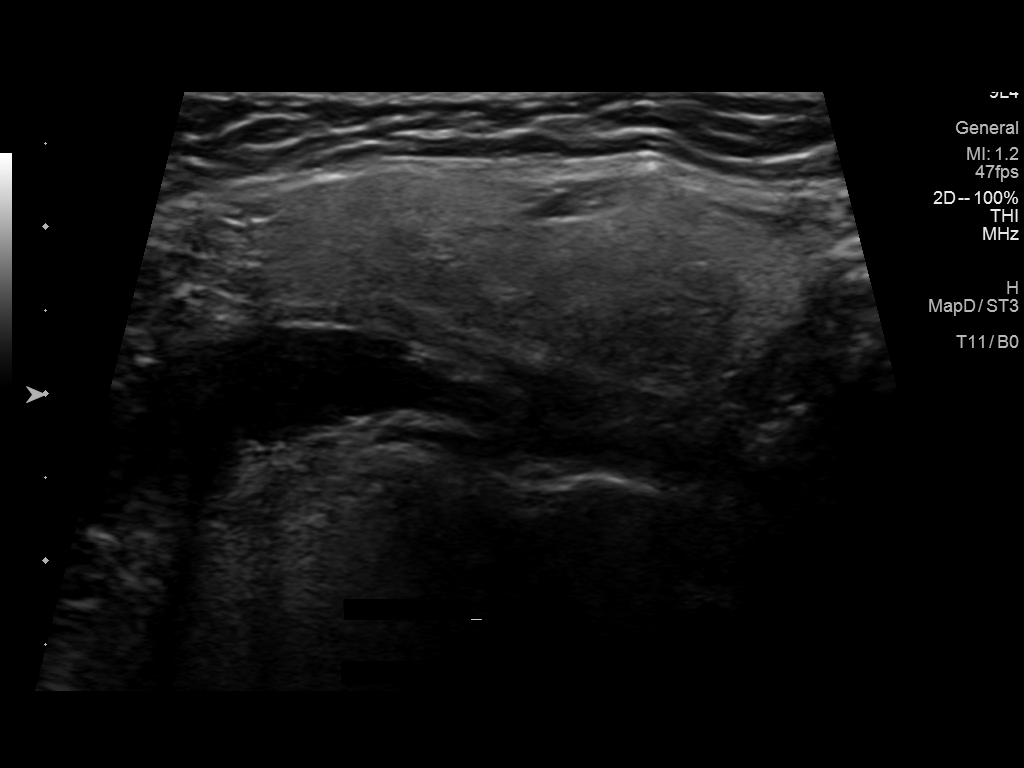
[im 4/7]
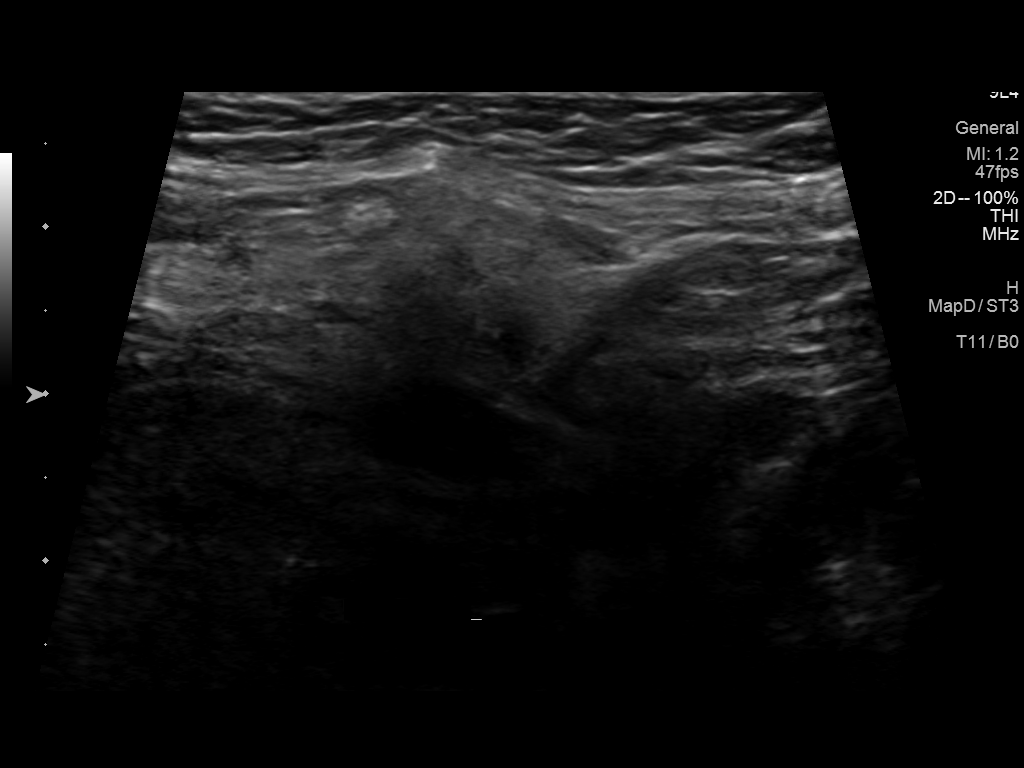
[im 5/7]
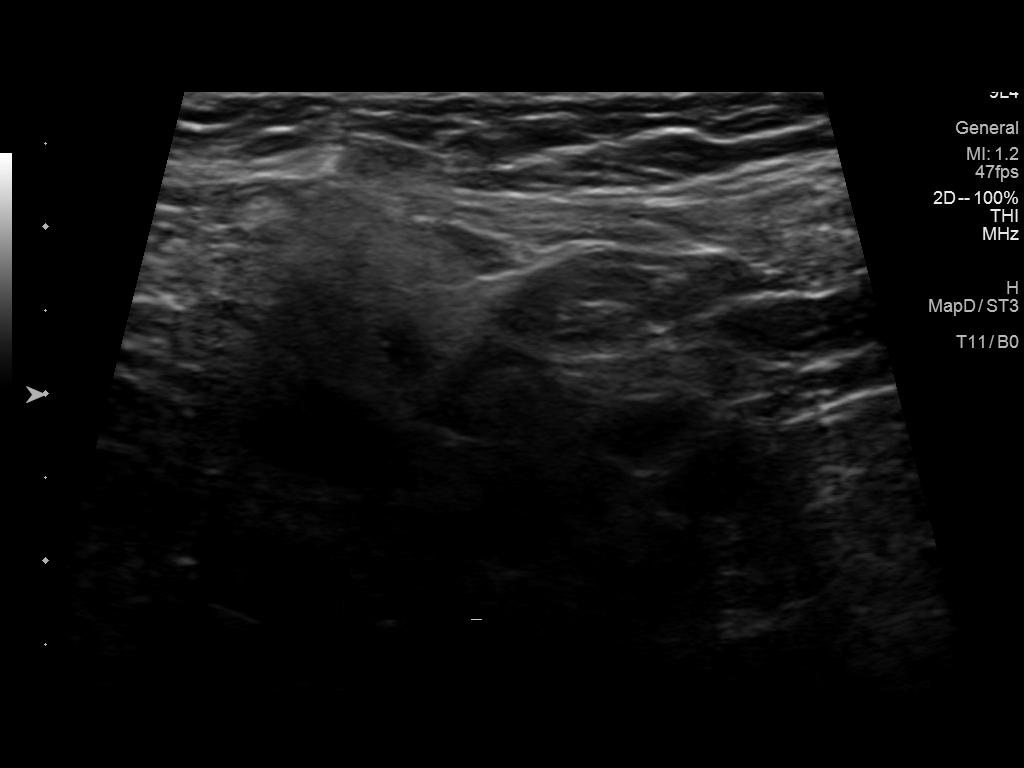
[im 6/7]
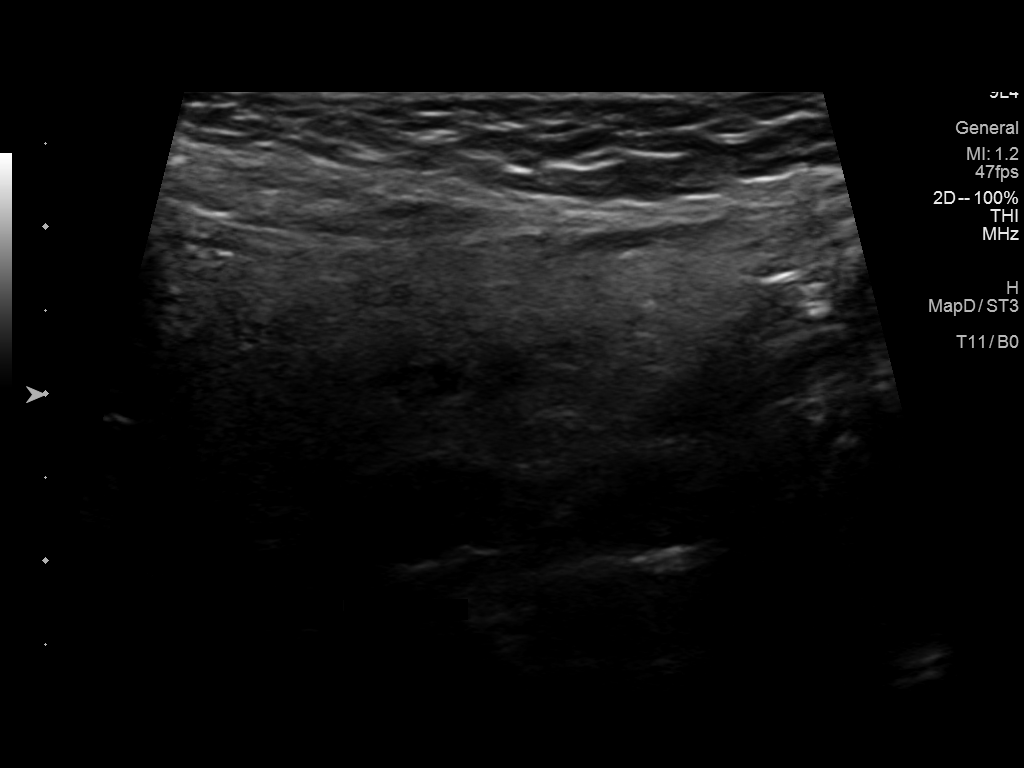
[im 7/7]
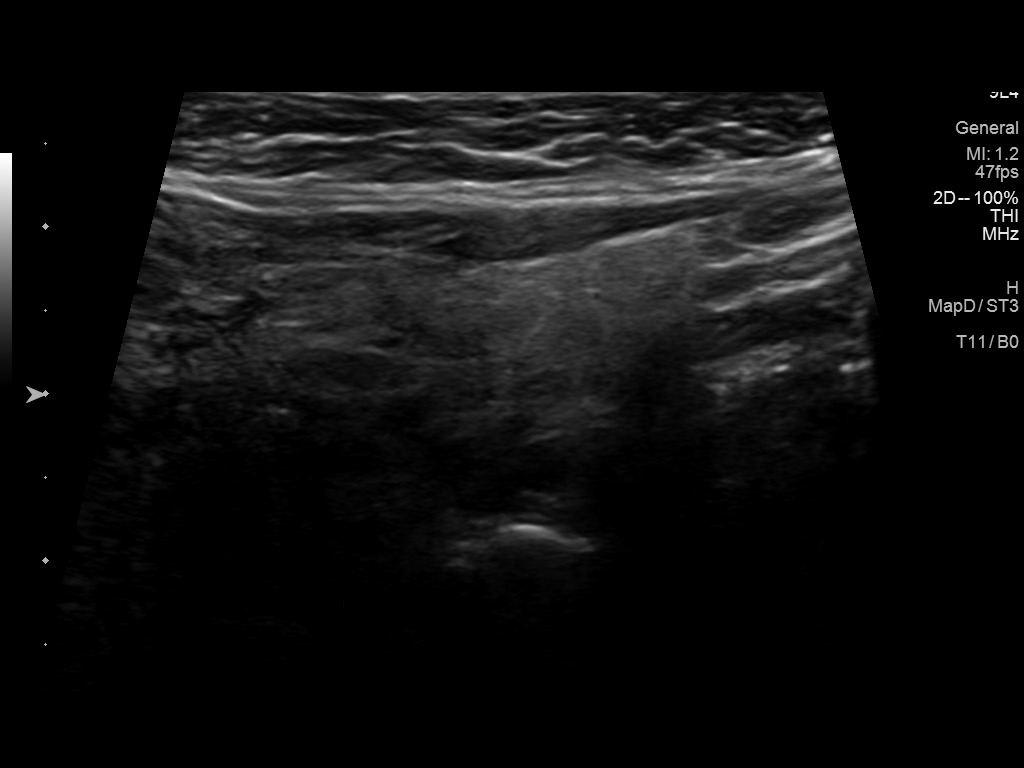

[7 of 7 positions shown; findings below may reference images not displayed]

FINDINGS: Normal appearing submandibular gland is adjacent to the area in
question. A benign appearing lymph node measures 4 mm in short
access. No other focal lesions are present.
IMPRESSION: Benign appearing lymph node and normal appearing left submandibular
gland. No other focal lesion to explain a palpable lesion.

## 2022-01-12 ENCOUNTER — Ambulatory Visit: Payer: Medicare PPO | Admitting: Internal Medicine

## 2022-01-12 ENCOUNTER — Encounter: Payer: Self-pay | Admitting: Internal Medicine

## 2022-01-12 VITALS — BP 132/80 | HR 70 | Temp 98.4°F | Ht 65.0 in | Wt 152.2 lb

## 2022-01-12 DIAGNOSIS — E041 Nontoxic single thyroid nodule: Secondary | ICD-10-CM | POA: Diagnosis not present

## 2022-01-12 DIAGNOSIS — R7301 Impaired fasting glucose: Secondary | ICD-10-CM

## 2022-01-12 DIAGNOSIS — F5101 Primary insomnia: Secondary | ICD-10-CM

## 2022-01-12 DIAGNOSIS — E782 Mixed hyperlipidemia: Secondary | ICD-10-CM

## 2022-01-12 DIAGNOSIS — I10 Essential (primary) hypertension: Secondary | ICD-10-CM | POA: Diagnosis not present

## 2022-01-12 MED ORDER — TRAZODONE HCL 50 MG PO TABS: 25.0000 mg | ORAL_TABLET | Freq: Every evening | ORAL | 0 refills | Status: DC | PRN

## 2022-01-12 NOTE — Patient Instructions (Signed)
We will check the labs.  We have sent in trazodone to help with sleep. Try 1/2 pill at night time for sleep as needed.

## 2022-01-12 NOTE — Progress Notes (Signed)
   Subjective:   Patient ID: [name], female    DOB: December 21, 1940, 81 y.o.   MRN: [id number]  HPI The patient is a 81 YO female coming in for follow up.   Review of Systems  Constitutional: Negative.   HENT: Negative.    Eyes: Negative.   Respiratory:  Negative for cough, chest tightness and shortness of breath.   Cardiovascular:  Negative for chest pain, palpitations and leg swelling.  Gastrointestinal:  Negative for abdominal distention, abdominal pain, constipation, diarrhea, nausea and vomiting.  Musculoskeletal: Negative.   Skin: Negative.   Neurological: Negative.   Psychiatric/Behavioral:  Positive for sleep disturbance.     Objective:  Physical Exam Constitutional:      Appearance: She is well-developed.  HENT:     Head: Normocephalic and atraumatic.  Cardiovascular:     Rate and Rhythm: Normal rate and regular rhythm.  Pulmonary:     Effort: Pulmonary effort is normal. No respiratory distress.     Breath sounds: Normal breath sounds. No wheezing or rales.  Abdominal:     General: Bowel sounds are normal. There is no distension.     Palpations: Abdomen is soft.     Tenderness: There is no abdominal tenderness. There is no rebound.  Musculoskeletal:     Cervical back: Normal range of motion.  Skin:    General: Skin is warm and dry.  Neurological:     Mental Status: She is alert and oriented to person, place, and time.     Coordination: Coordination normal.     Vitals:   01/12/22 1459  BP: 132/80  Pulse: 70  Temp: 98.4 F (36.9 C)  TempSrc: Oral  SpO2: 97%  Weight: 152 lb 4 oz (69.1 kg)  Height: '5\' 5"'$  (1.651 m)    Assessment & Plan:

## 2022-01-14 ENCOUNTER — Encounter: Payer: Self-pay | Admitting: Internal Medicine

## 2022-01-14 NOTE — Assessment & Plan Note (Signed)
Checking TSH and free T4 to monitor and adjust as needed.

## 2022-01-14 NOTE — Assessment & Plan Note (Signed)
Checking HgA1c and checking prior to next visit as well.

## 2022-01-14 NOTE — Assessment & Plan Note (Signed)
Rx trazodone to take 25 mg qhs prn for sleep and up to 50 mg qhs prn sleep. 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

## 2022-01-14 NOTE — Assessment & Plan Note (Signed)
BP at goal on candesartan 24 mg daily. Checking CMP and CBC and adjust as needed. Has amlodipine 2.5 mg daily prn for high BP.

## 2022-01-14 NOTE — Assessment & Plan Note (Signed)
Checking lipid panel and adjust as needed her lipitor 5 mg 3 times a week.

## 2022-02-01 ENCOUNTER — Encounter: Payer: Self-pay | Admitting: Internal Medicine

## 2022-02-01 ENCOUNTER — Other Ambulatory Visit (INDEPENDENT_AMBULATORY_CARE_PROVIDER_SITE_OTHER): Payer: Medicare PPO

## 2022-02-01 DIAGNOSIS — I10 Essential (primary) hypertension: Secondary | ICD-10-CM

## 2022-02-01 DIAGNOSIS — R7301 Impaired fasting glucose: Secondary | ICD-10-CM

## 2022-02-01 DIAGNOSIS — E041 Nontoxic single thyroid nodule: Secondary | ICD-10-CM

## 2022-02-01 DIAGNOSIS — E782 Mixed hyperlipidemia: Secondary | ICD-10-CM | POA: Diagnosis not present

## 2022-02-01 LAB — COMPREHENSIVE METABOLIC PANEL
ALT: 15 U/L (ref 0–35)
AST: 22 U/L (ref 0–37)
Albumin: 4.3 g/dL (ref 3.5–5.2)
Alkaline Phosphatase: 43 U/L (ref 39–117)
BUN: 20 mg/dL (ref 6–23)
CO2: 29 mEq/L (ref 19–32)
Calcium: 9.3 mg/dL (ref 8.4–10.5)
Chloride: 104 mEq/L (ref 96–112)
Creatinine, Ser: 0.76 mg/dL (ref 0.40–1.20)
GFR: 74.76 mL/min (ref >60.00)
Glucose, Bld: 94 mg/dL (ref 70–99)
Potassium: 5.1 mEq/L (ref 3.5–5.1)
Sodium: 139 mEq/L (ref 135–145)
Total Bilirubin: 0.6 mg/dL (ref 0.2–1.2)
Total Protein: 7.4 g/dL (ref 6.0–8.3)

## 2022-02-01 LAB — LIPID PANEL
Cholesterol: 192 mg/dL (ref 0–200)
HDL: 54.1 mg/dL (ref >39.00)
LDL Cholesterol: 99 mg/dL (ref 0–99)
NonHDL: 137.91
Total CHOL/HDL Ratio: 4
Triglycerides: 197 mg/dL — ABNORMAL HIGH (ref 0.0–149.0)
VLDL: 39.4 mg/dL (ref 0.0–40.0)

## 2022-02-01 LAB — CBC
HCT: 39.6 % (ref 36.0–46.0)
Hemoglobin: 13.1 g/dL (ref 12.0–15.0)
MCHC: 33.2 g/dL (ref 30.0–36.0)
MCV: 90.5 fl (ref 78.0–100.0)
Platelets: 237 10*3/uL (ref 150.0–400.0)
RBC: 4.38 Mil/uL (ref 3.87–5.11)
RDW: 13.5 % (ref 11.5–15.5)
WBC: 3.8 10*3/uL — ABNORMAL LOW (ref 4.0–10.5)

## 2022-02-01 LAB — HEMOGLOBIN A1C: Hgb A1c MFr Bld: 6.2 % (ref 4.6–6.5)

## 2022-02-01 LAB — TSH: TSH: 0.78 u[IU]/mL (ref 0.35–5.50)

## 2022-02-01 LAB — T4, FREE: Free T4: 0.9 ng/dL (ref 0.60–1.60)

## 2022-02-02 DIAGNOSIS — N760 Acute vaginitis: Secondary | ICD-10-CM | POA: Diagnosis not present

## 2022-02-02 NOTE — Telephone Encounter (Signed)
Pt is inquiring about labs that were done yesterday, 02/01/22 and is concerned about her white blood cell count being on the lower end. She is wondering if it may mean she has any underlying health problems or if she needs any more tests on this matter?

## 2022-02-12 ENCOUNTER — Other Ambulatory Visit: Payer: Self-pay | Admitting: Internal Medicine

## 2022-02-15 ENCOUNTER — Other Ambulatory Visit: Payer: Self-pay | Admitting: Internal Medicine

## 2022-03-18 NOTE — Progress Notes (Signed)
Subjective:   [name] is a 81 y.o. female who presents for an Initial Medicare Annual Wellness Visit. I connected with  Princetta Uplinger on 03/19/22 by a audio enabled telemedicine application and verified that I am speaking with the correct person using two identifiers.  Patient Location: Home  Provider Location: Home Office  I discussed the limitations of evaluation and management by telemedicine. The patient expressed understanding and agreed to proceed.  Review of Systems    Deferred to PCP Cardiac Risk Factors include: dyslipidemia;hypertension     Objective:    Today's Vitals   03/19/22 1251  PainSc: 2    There is no height or weight on file to calculate BMI.     03/19/2022    1:02 PM 04/24/2020    2:39 AM 03/17/2020    5:57 PM 10/30/2015    1:42 PM  Advanced Directives  Does Patient Have a Medical Advance Directive? No No No No  Would patient like information on creating a medical advance directive? No - Patient declined No - Patient declined No - Patient declined No - patient declined information    Current Medications (verified) Outpatient Encounter Medications as of 03/19/2022  Medication Sig   amLODipine (NORVASC) 2.5 MG tablet Take 1 tablet (2.5 mg total) by mouth daily.   aspirin 81 MG tablet Take 81 mg by mouth daily.   atorvastatin (LIPITOR) 10 MG tablet Take 0.5 tablets (5 mg total) by mouth 3 (three) times a week.   candesartan (ATACAND) 4 MG tablet TAKE 1 TAB BY MOUTH IN THE MORNING & AT BEDTIME. TAKE WITH 16 MG FOR TOTAL DAILY DOSE 24 MG   Cholecalciferol 25 MCG (1000 UT) tablet Take 1,000 Units by mouth daily.   cloNIDine (CATAPRES) 0.1 MG tablet Take 1 tablet (0.1 mg total) by mouth daily as needed (if systolic BP >601 or diastolic >093).   fenofibrate 54 MG tablet TAKE 1 TABLET BY MOUTH 3 TIMES A WEEK   Magnesium 200 MG TABS Take 400 mg by mouth daily.   Multiple Vitamin (MULITIVITAMIN WITH MINERALS) TABS Take 1 tablet by mouth daily.    traZODone (DESYREL) 50 MG tablet Take 0.5-1 tablets (25-50 mg total) by mouth at bedtime as needed for sleep. (Patient not taking: Reported on 03/19/2022)   No facility-administered encounter medications on file as of 03/19/2022.    Allergies (verified) Crestor [rosuvastatin], Lipitor [atorvastatin], Epinephrine, Ioxaglate, and Ivp dye [iodinated contrast media]   History: Past Medical History:  Diagnosis Date   Arthritis    GERD (gastroesophageal reflux disease)    High cholesterol    Hypertension    Osteoporosis    Past Surgical History:  Procedure Laterality Date   ABDOMINAL HYSTERECTOMY     BREAST EXCISIONAL BIOPSY Right    Family History  Problem Relation Age of Onset   Liver cancer Other    Diabetes Mother    Social History   Socioeconomic History   Marital status: Married    Spouse name: Boyd Kerbs   Number of children: Not on file   Years of education: masters   Highest education level: Not on file  Occupational History   Not on file  Tobacco Use   Smoking status: Never   Smokeless tobacco: Never  Vaping Use   Vaping Use: Never used  Substance and Sexual Activity   Alcohol use: No   Drug use: No   Sexual activity: Not Currently  Other Topics Concern   Not on file  Social  History Narrative   Not on file   Social Determinants of Health   Financial Resource Strain: Low Risk  (03/19/2022)   Overall Financial Resource Strain (CARDIA)    Difficulty of Paying Living Expenses: Not hard at all  Food Insecurity: No Food Insecurity (03/19/2022)   Hunger Vital Sign    Worried About Running Out of Food in the Last Year: Never true    Ran Out of Food in the Last Year: Never true  Transportation Needs: No Transportation Needs (03/19/2022)   PRAPARE - Hydrologist (Medical): No    Lack of Transportation (Non-Medical): No  Physical Activity: Sufficiently Active (03/19/2022)   Exercise Vital Sign    Days of Exercise per Week: 7 days    Minutes  of Exercise per Session: 30 min  Stress: Stress Concern Present (03/19/2022)   [place]    Feeling of Stress : To some extent  Social Connections: Socially Integrated (03/19/2022)   Social Connection and Isolation Panel [NHANES]    Frequency of Communication with Friends and Family: More than three times a week    Frequency of Social Gatherings with Friends and Family: Three times a week    Attends Religious Services: 1 to 4 times per year    Active Member of Clubs or Organizations: Yes    Attends Archivist Meetings: 1 to 4 times per year    Marital Status: Married    Tobacco Counseling Counseling given: Not Answered   Clinical Intake:  Pre-visit preparation completed: Yes  Pain : 0-10 Pain Score: 2  Pain Type: Chronic pain Pain Location: Hand Pain Orientation: Right, Left Pain Descriptors / Indicators: Aching     Nutritional Status: BMI of 19-24  Normal Nutritional Risks: None Diabetes: No  How often do you need to have someone help you when you read instructions, pamphlets, or other written materials from your doctor or pharmacy?: 1 - Never What is the last grade level you completed in school?: master's  Diabetic?No  Interpreter Needed?: No  Information entered by :: Emelia Loron RN   Activities of Daily Living    03/19/2022    1:01 PM  In your present state of health, do you have any difficulty performing the following activities:  Hearing? 0  Vision? 0  Difficulty concentrating or making decisions? 0  Walking or climbing stairs? 0  Dressing or bathing? 0  Doing errands, shopping? 0  Preparing Food and eating ? N  Using the Toilet? N  In the past six months, have you accidently leaked urine? N  Do you have problems with loss of bowel control? N  Managing your Medications? N  Managing your Finances? N  Housekeeping or managing your Housekeeping? N    Patient Care  Team: Hoyt Koch, MD as PCP - General (Internal Medicine) Minus Breeding, MD as PCP - Cardiology (Cardiology)  Indicate any recent Medical Services you may have received from other than [facility] providers in the past year (date may be approximate).     Assessment:   This is a routine wellness examination for [name].  Hearing/Vision screen No results found.  Dietary issues and exercise activities discussed: Current Exercise Habits: Home exercise routine, Type of exercise: stretching;walking, Time (Minutes): 30, Frequency (Times/Week): 7, Weekly Exercise (Minutes/Week): 210, Intensity: Mild, Exercise limited by: None identified   Goals Addressed             This Visit's Progress  Patient Stated       Continue eat health and exercise.      Depression Screen    03/19/2022   12:55 PM 01/12/2022    3:03 PM 10/01/2020   10:06 AM  PHQ 2/9 Scores  PHQ - 2 Score 1 0 0    Fall Risk    03/19/2022    1:03 PM 01/12/2022    3:03 PM  [place] in the past year? 0 0  Number falls in past yr: 0 0  Injury with Fall? 0 0  Risk for fall due to : No Fall Risks No Fall Risks  Follow up Falls evaluation completed Falls evaluation completed    [place]:  Any stairs in or around the home? Yes  If so, are there any without handrails? Yes  Home free of loose throw rugs in walkways, pet beds, electrical cords, etc? Yes  Adequate lighting in your home to reduce risk of falls? Yes   ASSISTIVE DEVICES UTILIZED TO PREVENT FALLS:  Life alert? No  Use of a cane, walker or w/c? No  Grab bars in the bathroom? No  Shower chair or bench in shower? No  Elevated toilet seat or a handicapped toilet? No   Cognitive Function:    03/19/2022    1:03 PM  MMSE - Mini Mental State Exam  Not completed: Refused        Immunizations Immunization History  Administered Date(s) Administered   Influenza Split 02/03/2012, 02/14/2014, 01/23/2017,  02/05/2019, 02/15/2020   Influenza, High Dose Seasonal PF 02/03/2015, 01/29/2016, 01/26/2018, 02/06/2021, 02/11/2022   Influenza,inj,Quad PF,6+ Mos 03/22/2011   Influenza-Unspecified 03/22/2011, 02/03/2012, 02/06/2013, 01/23/2017, 02/05/2019, 02/15/2020, 03/17/2020   Moderna Sars-Covid-2 Vaccination 05/29/2019, 06/26/2019, 03/11/2020   Pneumococcal Conjugate-13 06/08/2013, 03/06/2015   Pneumococcal Polysaccharide-23 06/01/2009   Zoster Recombinat (Shingrix) 01/16/2017, 05/25/2017   Zoster, Live 05/17/2006, 01/16/2017, 05/25/2017   Zoster, Unspecified 05/25/2017    TDAP status: Due, Education has been provided regarding the importance of this vaccine. Advised may receive this vaccine at local pharmacy or Health Dept. Aware to provide a copy of the vaccination record if obtained from local pharmacy or Health Dept. Verbalized acceptance and understanding.  Flu Vaccine status: Up to date  Pneumococcal vaccine status: Up to date  Covid-19 vaccine status: Information provided on how to obtain vaccines.   Qualifies for Shingles Vaccine? Yes   Zostavax completed No   Shingrix Completed?: Yes  Screening Tests Health Maintenance  Topic Date Due   Hepatitis C Screening  Never done   TETANUS/TDAP  Never done   COVID-19 Vaccine (4 - Moderna risk series) 05/06/2020   Medicare Annual Wellness (AWV)  03/20/2023   Pneumonia Vaccine 66+ Years old  Completed   INFLUENZA VACCINE  Completed   DEXA SCAN  Completed   Zoster Vaccines- Shingrix  Completed   HPV VACCINES  Aged Out    Health Maintenance  Health Maintenance Due  Topic Date Due   Hepatitis C Screening  Never done   TETANUS/TDAP  Never done   COVID-19 Vaccine (4 - Moderna risk series) 05/06/2020    Colorectal cancer screening: No longer required.   Mammogram status: No longer required due to age.  Bone Density status: Completed 07/15/11. Results reflect: Bone density results: OSTEOPENIA. Repeat every never years.  Lung Cancer  Screening: (Low Dose CT Chest recommended if Age 65-80 years, 30 pack-year currently smoking OR have quit w/in 15years.) does not qualify.   Additional  Screening:  Hepatitis C Screening: does qualify; Completed education provided  Vision Screening: Recommended annual ophthalmology exams for early detection of glaucoma and other disorders of the eye. Is the patient up to date with their annual eye exam?  Yes  Who is the provider or what is the name of the office in which the patient attends annual eye exams? Dr. Prudencio Burly If pt is not established with a provider, would they like to be referred to a provider to establish care?  N/A .   Dental Screening: Recommended annual dental exams for proper oral hygiene  Community Resource Referral / Chronic Care Management: CRR required this visit?  No   CCM required this visit?  No      Plan:     I have personally reviewed and noted the following in the patient's chart:   Medical and social history Use of alcohol, tobacco or illicit drugs  Current medications and supplements including opioid prescriptions. Patient is not currently taking opioid prescriptions. Functional ability and status Nutritional status Physical activity Advanced directives List of other physicians Hospitalizations, surgeries, and ER visits in previous 12 months Vitals Screenings to include cognitive, depression, and falls Referrals and appointments  In addition, I have reviewed and discussed with patient certain preventive protocols, quality metrics, and best practice recommendations. A written personalized care plan for preventive services as well as general preventive health recommendations were provided to patient.     Michiel Cowboy, RN   03/19/2022   Nurse Notes:  Ms. Boulos , Thank you for taking time to come for your Medicare Wellness Visit. I appreciate your ongoing commitment to your health goals. Please review the following plan we discussed and let me know if I  can assist you in the future.   These are the goals we discussed:  Goals      Patient Stated     Continue eat health and exercise.        This is a list of the screening recommended for you and due dates:  Health Maintenance  Topic Date Due   Hepatitis C Screening: USPSTF Recommendation to screen - Ages 56-79 yo.  Never done   Tetanus Vaccine  Never done   COVID-19 Vaccine (4 - Moderna risk series) 05/06/2020   Medicare Annual Wellness Visit  03/20/2023   Pneumonia Vaccine  Completed   Flu Shot  Completed   DEXA scan (bone density measurement)  Completed   Zoster (Shingles) Vaccine  Completed   HPV Vaccine  Aged Out

## 2022-03-18 NOTE — Patient Instructions (Signed)

## 2022-03-19 ENCOUNTER — Ambulatory Visit (INDEPENDENT_AMBULATORY_CARE_PROVIDER_SITE_OTHER): Payer: Medicare PPO | Admitting: *Deleted

## 2022-03-19 DIAGNOSIS — Z0000 Encounter for general adult medical examination without abnormal findings: Secondary | ICD-10-CM | POA: Diagnosis not present

## 2022-04-03 ENCOUNTER — Other Ambulatory Visit: Payer: Self-pay | Admitting: Cardiology

## 2022-04-10 ENCOUNTER — Other Ambulatory Visit: Payer: Self-pay | Admitting: Internal Medicine

## 2022-04-17 ENCOUNTER — Other Ambulatory Visit: Payer: Self-pay | Admitting: Internal Medicine

## 2022-04-18 ENCOUNTER — Other Ambulatory Visit: Payer: Self-pay | Admitting: Cardiology

## 2022-04-19 ENCOUNTER — Other Ambulatory Visit: Payer: Self-pay | Admitting: Cardiology

## 2022-04-20 ENCOUNTER — Other Ambulatory Visit: Payer: Self-pay | Admitting: Internal Medicine

## 2022-04-21 ENCOUNTER — Encounter: Payer: Self-pay | Admitting: Internal Medicine

## 2022-04-21 NOTE — Telephone Encounter (Signed)
Patient sent a my chart message regarding this prescription to Dr Sharlet Salina

## 2022-04-22 MED ORDER — CANDESARTAN CILEXETIL 16 MG PO TABS: 16.0000 mg | ORAL_TABLET | Freq: Every day | ORAL | 3 refills | Status: DC

## 2022-05-12 ENCOUNTER — Other Ambulatory Visit: Payer: Self-pay | Admitting: Cardiology

## 2022-05-28 DIAGNOSIS — L608 Other nail disorders: Secondary | ICD-10-CM | POA: Diagnosis not present

## 2022-05-28 DIAGNOSIS — D485 Neoplasm of uncertain behavior of skin: Secondary | ICD-10-CM | POA: Diagnosis not present

## 2022-06-05 DIAGNOSIS — R059 Cough, unspecified: Secondary | ICD-10-CM | POA: Diagnosis not present

## 2022-06-05 DIAGNOSIS — R030 Elevated blood-pressure reading, without diagnosis of hypertension: Secondary | ICD-10-CM | POA: Diagnosis not present

## 2022-06-05 DIAGNOSIS — J029 Acute pharyngitis, unspecified: Secondary | ICD-10-CM | POA: Diagnosis not present

## 2022-06-18 ENCOUNTER — Ambulatory Visit (INDEPENDENT_AMBULATORY_CARE_PROVIDER_SITE_OTHER): Payer: Medicare PPO

## 2022-06-18 ENCOUNTER — Telehealth: Payer: Medicare PPO | Admitting: Physician Assistant

## 2022-06-18 ENCOUNTER — Ambulatory Visit: Payer: Medicare PPO | Admitting: Internal Medicine

## 2022-06-18 ENCOUNTER — Ambulatory Visit
Admission: EM | Admit: 2022-06-18 | Discharge: 2022-06-18 | Disposition: A | Discharge status: Home / Self Care | Payer: Medicare PPO | Attending: Urgent Care | Admitting: Urgent Care

## 2022-06-18 DIAGNOSIS — R0989 Other specified symptoms and signs involving the circulatory and respiratory systems: Secondary | ICD-10-CM

## 2022-06-18 DIAGNOSIS — R0602 Shortness of breath: Secondary | ICD-10-CM

## 2022-06-18 DIAGNOSIS — J209 Acute bronchitis, unspecified: Secondary | ICD-10-CM

## 2022-06-18 DIAGNOSIS — I10 Essential (primary) hypertension: Secondary | ICD-10-CM | POA: Diagnosis not present

## 2022-06-18 DIAGNOSIS — Z8616 Personal history of COVID-19: Secondary | ICD-10-CM

## 2022-06-18 DIAGNOSIS — U071 COVID-19: Secondary | ICD-10-CM

## 2022-06-18 DIAGNOSIS — R0902 Hypoxemia: Secondary | ICD-10-CM

## 2022-06-18 DIAGNOSIS — R059 Cough, unspecified: Secondary | ICD-10-CM

## 2022-06-18 MED ORDER — BENZONATATE 100 MG PO CAPS: 100.0000 mg | ORAL_CAPSULE | Freq: Three times a day (TID) | ORAL | 0 refills | Status: DC | PRN

## 2022-06-18 MED ORDER — PROMETHAZINE-DM 6.25-15 MG/5ML PO SYRP: 5.0000 mL | ORAL_SOLUTION | Freq: Three times a day (TID) | ORAL | 0 refills | Status: DC | PRN

## 2022-06-18 NOTE — ED Provider Notes (Signed)
[facility]  Note:  This document was prepared using Systems analyst and may include unintentional dictation errors.  MRN: [id number] DOB: [birth year]  Subjective:   [name] is a 82 y.o. female presenting for 2-week history of persistent fever, chest congestion and bodyaches.  Patient tested positive for COVID-19 06/06/2022, completed Paxlovid treatment but still feels sick.  No history of respiratory disorders.  Patient does have a history of hypertension and reports compliance with her medications. Patient also was prescribed a course of azithromycin, did not fill the script with Paxlovid but started taking that today. Has a history of bronchitis.   No current facility-administered medications for this encounter.  Current Outpatient Medications:    amLODipine (NORVASC) 2.5 MG tablet, Take 1 tablet (2.5 mg total) by mouth daily., Disp: 90 tablet, Rfl: 3   aspirin 81 MG tablet, Take 81 mg by mouth daily., Disp: , Rfl:    atorvastatin (LIPITOR) 10 MG tablet, TAKE 0.5 TABLET (5 MG) BY MOUTH THREE TIMES A WEEK. PATIENT NEEDS APPOINTMENT FOR FURTHER REFILLS., Disp: 90 tablet, Rfl: 0   candesartan (ATACAND) 16 MG tablet, Take 1 tablet (16 mg total) by mouth daily., Disp: 90 tablet, Rfl: 3   candesartan (ATACAND) 4 MG tablet, TAKE 1 TAB BY MOUTH IN THE MORNING & AT BEDTIME. TAKE WITH 16 MG FOR TOTAL DAILY DOSE 24 MG, Disp: 180 tablet, Rfl: 3   Cholecalciferol 25 MCG (1000 UT) tablet, Take 1,000 Units by mouth daily., Disp: , Rfl:    cloNIDine (CATAPRES) 0.1 MG tablet, Take 1 tablet (0.1 mg total) by mouth daily as needed (if systolic BP >469 or diastolic >629)., Disp: 90 tablet, Rfl: 3   fenofibrate 54 MG tablet, TAKE 1 TABLET BY MOUTH 3 TIMES A WEEK, Disp: 50 tablet, Rfl: 3   Magnesium 200 MG TABS, Take 400 mg by mouth daily., Disp: , Rfl:    Multiple Vitamin (MULITIVITAMIN WITH MINERALS) TABS, Take 1 tablet by mouth daily., Disp: , Rfl:     traZODone (DESYREL) 50 MG tablet, TAKE 1/2 TO 1 TABLET BY MOUTH AT BEDTIME AS NEEDED FOR SLEEP, Disp: 90 tablet, Rfl: 0   Allergies  Allergen Reactions   Crestor [Rosuvastatin]     Arm pain   Lipitor [Atorvastatin]     Leg cramps.   Epinephrine Palpitations   Ioxaglate Palpitations   Ivp Dye [Iodinated Contrast Media] Palpitations    Past Medical History:  Diagnosis Date   Arthritis    GERD (gastroesophageal reflux disease)    High cholesterol    Hypertension    Osteoporosis      Past Surgical History:  Procedure Laterality Date   ABDOMINAL HYSTERECTOMY     BREAST EXCISIONAL BIOPSY Right     Family History  Problem Relation Age of Onset   Liver cancer Other    Diabetes Mother     Social History   Tobacco Use   Smoking status: Never   Smokeless tobacco: Never  Vaping Use   Vaping Use: Never used  Substance Use Topics   Alcohol use: No   Drug use: No    ROS   Objective:   Vitals: BP (!) 203/93 (BP Location: Right Arm)   Pulse 96   Temp 99.1 F (37.3 C) (Oral)   Resp 18   SpO2 97%   Blood pressure recheck was 168/83.  BP Readings from Last 3 Encounters:  06/18/22 (!) 203/93  01/12/22 132/80  08/04/21 (!) 160/80  Physical Exam Constitutional:      General: She is not in acute distress.    Appearance: Normal appearance. She is well-developed. She is not ill-appearing, toxic-appearing or diaphoretic.  HENT:     Head: Normocephalic and atraumatic.     Nose: Nose normal.     Mouth/Throat:     Mouth: Mucous membranes are moist.  Eyes:     General: No scleral icterus.       Right eye: No discharge.        Left eye: No discharge.     Extraocular Movements: Extraocular movements intact.  Cardiovascular:     Rate and Rhythm: Normal rate and regular rhythm.     Heart sounds: Normal heart sounds. No murmur heard.    No friction rub. No gallop.  Pulmonary:     Effort: Pulmonary effort is normal. No respiratory distress.     Breath sounds: No  stridor. No wheezing, rhonchi or rales.  Chest:     Chest wall: No tenderness.  Skin:    General: Skin is warm and dry.  Neurological:     General: No focal deficit present.     Mental Status: She is alert and oriented to person, place, and time.     Cranial Nerves: No cranial nerve deficit.     Motor: No weakness.     Coordination: Coordination normal.     Gait: Gait normal.     Comments: No facial asymmetry, negative Romberg and pronator drift.  Psychiatric:        Mood and Affect: Mood normal.        Behavior: Behavior normal.    DG Chest 2 View  Result Date: 06/18/2022 CLINICAL DATA:  Cough and congestion.  COVID positive EXAM: Chest two views COMPARISON:  None Available. FINDINGS: The heart size and mediastinal contours are within normal limits. Both lungs are clear. The visualized skeletal structures are unremarkable. IMPRESSION: No active cardiopulmonary disease. Electronically Signed   By: Suzy Bouchard M.D.   On: 06/18/2022 13:58     Assessment and Plan :   PDMP not reviewed this encounter.  1. Acute bronchitis, unspecified organism   2. Chest congestion   3. History of COVID-19   4. Essential hypertension     Recommend supportive care.  Offered prednisone due to her history of bronchitis and chest congestion.  Patient declined due to the effects that has on her blood pressure.  Advised that she could use the azithromycin that was prescribed before.  She wanted more Paxlovid but I counseled against this as it is not medically supported to use this far out and after she has already undergone a course.  Follow-up with PCP.  Counseled patient on potential for adverse effects with medications prescribed/recommended today, ER and return-to-clinic precautions discussed, patient verbalized understanding.    Jaynee Eagles, [place] 06/18/22 1554

## 2022-06-18 NOTE — Progress Notes (Signed)
Because of having an oxygen reading below 90, I feel your condition warrants further evaluation and I recommend that you be seen in a face to face visit.You should be evaluated for pneumonia.   NOTE: There will be NO CHARGE for this eVisit   If you are having a true medical emergency please call 911.      For an urgent face to face visit, [facility] has eight urgent care centers for your convenience:   NEW!! [facility] [place] [facility] [place] Get Driving Directions [phone] [address] [place], [place] [facility] [place] [facility] [place] Get Driving Directions [phone] [place], [place] [address]   [place] [facility]) Get Driving Directions [phone] [address], [place] [address]  [facility] [place] ([place]) Get Driving Directions [phone] [address] [place],  [place]  [address]  [facility] - at [facility] Get Driving Directions  [phone] [address] [place],  [place] [address]   [facility] Get Driving Directions [phone] [place] [address], [place], [place] [address]   [facility] Get Driving Directions  [phone] [address] [place], [place] [address]   [facility] [place] Get Driving Directions [phone] [address] [place], [place] [address]  Your MyChart E-visit questionnaire answers were reviewed by a board certified advanced clinical practitioner to complete your personal care plan based on your specific symptoms.  Thank you for using e-Visits.   I have spent 5 minutes in review of e-visit questionnaire, review and updating patient chart, medical decision making and response to patient.   Mar Daring, PA-C

## 2022-06-18 NOTE — ED Triage Notes (Signed)
Pt states she was +covid 1/21 and completed paxlovid-states her husband is +covid-yesterday she started having fever, chest congestion, body aches-NAD-steady gait

## 2022-07-02 DIAGNOSIS — D485 Neoplasm of uncertain behavior of skin: Secondary | ICD-10-CM | POA: Diagnosis not present

## 2022-07-02 DIAGNOSIS — L570 Actinic keratosis: Secondary | ICD-10-CM | POA: Diagnosis not present

## 2022-07-09 DIAGNOSIS — C44311 Basal cell carcinoma of skin of nose: Secondary | ICD-10-CM | POA: Diagnosis not present

## 2022-07-13 ENCOUNTER — Ambulatory Visit: Payer: Medicare PPO | Admitting: Internal Medicine

## 2022-07-13 ENCOUNTER — Encounter: Payer: Self-pay | Admitting: Internal Medicine

## 2022-07-13 VITALS — BP 180/80 | HR 78 | Temp 97.8°F | Ht 65.0 in | Wt 145.1 lb

## 2022-07-13 DIAGNOSIS — I10 Essential (primary) hypertension: Secondary | ICD-10-CM

## 2022-07-13 DIAGNOSIS — R7301 Impaired fasting glucose: Secondary | ICD-10-CM

## 2022-07-13 DIAGNOSIS — E785 Hyperlipidemia, unspecified: Secondary | ICD-10-CM | POA: Diagnosis not present

## 2022-07-13 LAB — CBC WITH DIFFERENTIAL/PLATELET
Basophils Absolute: 0 10*3/uL (ref 0.0–0.1)
Basophils Relative: 0.5 % (ref 0.0–3.0)
Eosinophils Absolute: 0.1 10*3/uL (ref 0.0–0.7)
Eosinophils Relative: 2.2 % (ref 0.0–5.0)
HCT: 40.4 % (ref 36.0–46.0)
Hemoglobin: 13.6 g/dL (ref 12.0–15.0)
Lymphocytes Relative: 40.5 % (ref 12.0–46.0)
Lymphs Abs: 1.7 10*3/uL (ref 0.7–4.0)
MCHC: 33.6 g/dL (ref 30.0–36.0)
MCV: 89.2 fl (ref 78.0–100.0)
Monocytes Absolute: 0.4 10*3/uL (ref 0.1–1.0)
Monocytes Relative: 9.1 % (ref 3.0–12.0)
Neutro Abs: 2 10*3/uL (ref 1.4–7.7)
Neutrophils Relative %: 47.7 % (ref 43.0–77.0)
Platelets: 264 10*3/uL (ref 150.0–400.0)
RBC: 4.52 Mil/uL (ref 3.87–5.11)
RDW: 13.6 % (ref 11.5–15.5)
WBC: 4.2 10*3/uL (ref 4.0–10.5)

## 2022-07-13 LAB — COMPREHENSIVE METABOLIC PANEL
ALT: 17 U/L (ref 0–35)
AST: 24 U/L (ref 0–37)
Albumin: 4.2 g/dL (ref 3.5–5.2)
Alkaline Phosphatase: 49 U/L (ref 39–117)
BUN: 18 mg/dL (ref 6–23)
CO2: 31 mEq/L (ref 19–32)
Calcium: 9.8 mg/dL (ref 8.4–10.5)
Chloride: 101 mEq/L (ref 96–112)
Creatinine, Ser: 0.63 mg/dL (ref 0.40–1.20)
GFR: 84.37 mL/min (ref >60.00)
Glucose, Bld: 90 mg/dL (ref 70–99)
Potassium: 4.6 mEq/L (ref 3.5–5.1)
Sodium: 140 mEq/L (ref 135–145)
Total Bilirubin: 0.5 mg/dL (ref 0.2–1.2)
Total Protein: 7.4 g/dL (ref 6.0–8.3)

## 2022-07-13 LAB — HEMOGLOBIN A1C: Hgb A1c MFr Bld: 6.3 % (ref 4.6–6.5)

## 2022-07-13 LAB — LIPID PANEL
Cholesterol: 254 mg/dL — ABNORMAL HIGH (ref 0–200)
HDL: 51.8 mg/dL (ref >39.00)
NonHDL: 201.72
Total CHOL/HDL Ratio: 5
Triglycerides: 296 mg/dL — ABNORMAL HIGH (ref 0.0–149.0)
VLDL: 59.2 mg/dL — ABNORMAL HIGH (ref 0.0–40.0)

## 2022-07-13 LAB — LDL CHOLESTEROL, DIRECT: Direct LDL: 130 mg/dL

## 2022-07-13 MED ORDER — OSELTAMIVIR PHOSPHATE 75 MG PO CAPS: 75.0000 mg | ORAL_CAPSULE | Freq: Two times a day (BID) | ORAL | 0 refills | Status: AC

## 2022-07-13 MED ORDER — NIRMATRELVIR/RITONAVIR (PAXLOVID)TABLET: 3.0000 | ORAL_TABLET | Freq: Two times a day (BID) | ORAL | 0 refills | Status: AC

## 2022-07-13 NOTE — Assessment & Plan Note (Signed)
BP elevated today and she has had some higher readings at home. She has increased candesartan to 28 mg daily as needed. She stopped amlodipine 2.5 mg daily as it was not helping. She is having moh's tomorrow and does not want change today. Encouraged to monitor BP readings and let us know if persistently >150 and we can add another medicine. Checking CMP today.

## 2022-07-13 NOTE — Progress Notes (Signed)
   Subjective:   Patient ID: [name], female    DOB: 1941-03-12, 82 y.o.   MRN: [id number]  HPI The patient is a 82 YO female coming in for follow up.  Review of Systems  Constitutional: Negative.   HENT: Negative.    Eyes: Negative.   Respiratory:  Negative for cough, chest tightness and shortness of breath.   Cardiovascular:  Negative for chest pain, palpitations and leg swelling.  Gastrointestinal:  Negative for abdominal distention, abdominal pain, constipation, diarrhea, nausea and vomiting.  Musculoskeletal: Negative.   Skin: Negative.        Skin lesion  Neurological: Negative.   Psychiatric/Behavioral: Negative.      Objective:  Physical Exam Constitutional:      Appearance: She is well-developed.  HENT:     Head: Normocephalic and atraumatic.  Cardiovascular:     Rate and Rhythm: Normal rate and regular rhythm.  Pulmonary:     Effort: Pulmonary effort is normal. No respiratory distress.     Breath sounds: Normal breath sounds. No wheezing or rales.  Abdominal:     General: Bowel sounds are normal. There is no distension.     Palpations: Abdomen is soft.     Tenderness: There is no abdominal tenderness. There is no rebound.  Musculoskeletal:     Cervical back: Normal range of motion.  Skin:    General: Skin is warm and dry.  Neurological:     Mental Status: She is alert and oriented to person, place, and time.     Coordination: Coordination normal.     Vitals:   07/13/22 1029  BP: (!) 180/80  Pulse: 78  Temp: 97.8 F (36.6 C)  TempSrc: Oral  SpO2: 99%  Weight: 145 lb 2 oz (65.8 kg)  Height: 5' 5"$  (1.651 m)    Assessment & Plan:

## 2022-07-13 NOTE — Assessment & Plan Note (Signed)
Checking lipid panel and adjust as needed. She is taking lipitor 5 mg 3 times a week.

## 2022-07-13 NOTE — Patient Instructions (Signed)
I will send in the flu medicine and covid-19 medicine to take with you.

## 2022-07-13 NOTE — Assessment & Plan Note (Signed)
Checking HgA1c and adjust as needed.  

## 2022-07-14 DIAGNOSIS — C44311 Basal cell carcinoma of skin of nose: Secondary | ICD-10-CM | POA: Diagnosis not present

## 2022-07-17 ENCOUNTER — Other Ambulatory Visit: Payer: Self-pay | Admitting: Internal Medicine

## 2022-08-11 ENCOUNTER — Ambulatory Visit: Payer: Medicare PPO | Admitting: Internal Medicine

## 2022-08-11 ENCOUNTER — Encounter: Payer: Self-pay | Admitting: Internal Medicine

## 2022-08-11 VITALS — BP 152/72 | HR 79 | Temp 98.7°F | Ht 65.0 in | Wt 149.0 lb

## 2022-08-11 DIAGNOSIS — F40243 Fear of flying: Secondary | ICD-10-CM | POA: Diagnosis not present

## 2022-08-11 DIAGNOSIS — I10 Essential (primary) hypertension: Secondary | ICD-10-CM | POA: Diagnosis not present

## 2022-08-11 MED ORDER — LORAZEPAM 0.5 MG PO TABS: 0.2500 mg | ORAL_TABLET | Freq: Two times a day (BID) | ORAL | 0 refills | Status: DC | PRN

## 2022-08-11 NOTE — Patient Instructions (Signed)
We have sent in the ativan.

## 2022-08-11 NOTE — Progress Notes (Signed)
   Subjective:   Patient ID: [name], female    DOB: 24-Feb-1941, 82 y.o.   MRN: [id number]  HPI The patient is a 82 YO female coming in for upcoming trip and needs refill of medication to use while traveling for sleep/anxiety.   Review of Systems  Constitutional: Negative.   HENT: Negative.    Eyes: Negative.   Respiratory:  Negative for cough, chest tightness and shortness of breath.   Cardiovascular:  Negative for chest pain, palpitations and leg swelling.  Gastrointestinal:  Negative for abdominal distention, abdominal pain, constipation, diarrhea, nausea and vomiting.  Musculoskeletal: Negative.   Skin:  Positive for color change.  Neurological: Negative.   Psychiatric/Behavioral: Negative.      Objective:  Physical Exam Constitutional:      Appearance: She is well-developed.  HENT:     Head: Normocephalic and atraumatic.     Comments: Recent procedure on nose with color change consistent with recent procedure.  Cardiovascular:     Rate and Rhythm: Normal rate and regular rhythm.  Pulmonary:     Effort: Pulmonary effort is normal. No respiratory distress.     Breath sounds: Normal breath sounds. No wheezing or rales.  Abdominal:     General: Bowel sounds are normal. There is no distension.     Palpations: Abdomen is soft.     Tenderness: There is no abdominal tenderness. There is no rebound.  Musculoskeletal:     Cervical back: Normal range of motion.  Skin:    General: Skin is warm and dry.  Neurological:     Mental Status: She is alert and oriented to person, place, and time.     Coordination: Coordination normal.     Vitals:   08/11/22 1044 08/11/22 1057  BP: (!) 150/70 (!) 152/72  Pulse: 79   Temp: 98.7 F (37.1 C)   TempSrc: Oral   SpO2: 96%   Weight: 149 lb (67.6 kg)   Height: 5\' 5"  (1.651 m)     Assessment & Plan:

## 2022-08-12 NOTE — Assessment & Plan Note (Signed)
BP mildly elevated due to poor sleep last night. She will continue candesartan total 24 mg daily dosing. Uses clonidine prn and has not used recently. BP 130s/70s typically at home.

## 2022-08-12 NOTE — Assessment & Plan Note (Signed)
Has used ativan in the past which we will prescribe for her at lowest dosing 0.5 mg daily prn #15. She can take 1/2-1 pill as needed for flying and with travel.

## 2022-08-25 ENCOUNTER — Other Ambulatory Visit: Payer: Self-pay | Admitting: Internal Medicine

## 2022-08-25 DIAGNOSIS — Z1231 Encounter for screening mammogram for malignant neoplasm of breast: Secondary | ICD-10-CM

## 2022-09-21 ENCOUNTER — Ambulatory Visit
Admission: RE | Admit: 2022-09-21 | Discharge: 2022-09-21 | Disposition: A | Discharge status: Home / Self Care | Payer: Medicare PPO | Source: Ambulatory Visit

## 2022-09-21 DIAGNOSIS — Z1231 Encounter for screening mammogram for malignant neoplasm of breast: Secondary | ICD-10-CM | POA: Diagnosis not present

## 2022-09-24 ENCOUNTER — Encounter: Payer: Self-pay | Admitting: Internal Medicine

## 2022-09-24 LAB — HM MAMMOGRAPHY

## 2022-10-14 ENCOUNTER — Encounter: Payer: Self-pay | Admitting: Internal Medicine

## 2022-10-14 ENCOUNTER — Ambulatory Visit: Payer: Medicare PPO | Admitting: Internal Medicine

## 2022-10-14 ENCOUNTER — Ambulatory Visit (INDEPENDENT_AMBULATORY_CARE_PROVIDER_SITE_OTHER): Payer: Medicare PPO

## 2022-10-14 VITALS — BP 152/74 | HR 69 | Temp 98.2°F | Ht 65.0 in | Wt 150.4 lb

## 2022-10-14 DIAGNOSIS — E785 Hyperlipidemia, unspecified: Secondary | ICD-10-CM

## 2022-10-14 DIAGNOSIS — L659 Nonscarring hair loss, unspecified: Secondary | ICD-10-CM

## 2022-10-14 DIAGNOSIS — R7301 Impaired fasting glucose: Secondary | ICD-10-CM

## 2022-10-14 DIAGNOSIS — M79672 Pain in left foot: Secondary | ICD-10-CM

## 2022-10-14 DIAGNOSIS — M19072 Primary osteoarthritis, left ankle and foot: Secondary | ICD-10-CM | POA: Diagnosis not present

## 2022-10-14 DIAGNOSIS — I10 Essential (primary) hypertension: Secondary | ICD-10-CM

## 2022-10-14 LAB — COMPREHENSIVE METABOLIC PANEL
ALT: 17 U/L (ref 0–35)
AST: 23 U/L (ref 0–37)
Albumin: 4.3 g/dL (ref 3.5–5.2)
Alkaline Phosphatase: 42 U/L (ref 39–117)
BUN: 17 mg/dL (ref 6–23)
CO2: 28 mEq/L (ref 19–32)
Calcium: 9.2 mg/dL (ref 8.4–10.5)
Chloride: 103 mEq/L (ref 96–112)
Creatinine, Ser: 0.69 mg/dL (ref 0.40–1.20)
GFR: 82.4 mL/min (ref >60.00)
Glucose, Bld: 91 mg/dL (ref 70–99)
Potassium: 4.6 mEq/L (ref 3.5–5.1)
Sodium: 140 mEq/L (ref 135–145)
Total Bilirubin: 0.4 mg/dL (ref 0.2–1.2)
Total Protein: 7.3 g/dL (ref 6.0–8.3)

## 2022-10-14 LAB — LIPID PANEL
Cholesterol: 220 mg/dL — ABNORMAL HIGH (ref 0–200)
HDL: 52.6 mg/dL (ref >39.00)
NonHDL: 167.2
Total CHOL/HDL Ratio: 4
Triglycerides: 221 mg/dL — ABNORMAL HIGH (ref 0.0–149.0)
VLDL: 44.2 mg/dL — ABNORMAL HIGH (ref 0.0–40.0)

## 2022-10-14 LAB — CBC
HCT: 39.3 % (ref 36.0–46.0)
Hemoglobin: 12.8 g/dL (ref 12.0–15.0)
MCHC: 32.6 g/dL (ref 30.0–36.0)
MCV: 91.2 fl (ref 78.0–100.0)
Platelets: 249 10*3/uL (ref 150.0–400.0)
RBC: 4.31 Mil/uL (ref 3.87–5.11)
RDW: 13.6 % (ref 11.5–15.5)
WBC: 3.6 10*3/uL — ABNORMAL LOW (ref 4.0–10.5)

## 2022-10-14 LAB — LDL CHOLESTEROL, DIRECT: Direct LDL: 120 mg/dL

## 2022-10-14 LAB — HEMOGLOBIN A1C: Hgb A1c MFr Bld: 6.2 % (ref 4.6–6.5)

## 2022-10-14 LAB — VITAMIN B12: Vitamin B-12: 1087 pg/mL — ABNORMAL HIGH (ref 211–911)

## 2022-10-14 LAB — TSH: TSH: 1.45 u[IU]/mL (ref 0.35–5.50)

## 2022-10-14 LAB — VITAMIN D 25 HYDROXY (VIT D DEFICIENCY, FRACTURES): VITD: 40.97 ng/mL (ref 30.00–100.00)

## 2022-10-14 MED ORDER — MINOXIDIL 2.5 MG PO TABS: 2.5000 mg | ORAL_TABLET | Freq: Every day | ORAL | 1 refills | Status: DC

## 2022-10-14 NOTE — Progress Notes (Signed)
   Subjective:   Patient ID: [name], female    DOB: 1941/02/26, 82 y.o.   MRN: [id number]  HPI The patient is a 82 YO female coming in for follow up and new hair loss and left foot pain.  Review of Systems  Constitutional: Negative.   HENT: Negative.    Eyes: Negative.   Respiratory:  Negative for cough, chest tightness and shortness of breath.   Cardiovascular:  Negative for chest pain, palpitations and leg swelling.  Gastrointestinal:  Negative for abdominal distention, abdominal pain, constipation, diarrhea, nausea and vomiting.  Musculoskeletal:  Positive for arthralgias.  Skin: Negative.        Hair loss  Neurological: Negative.   Psychiatric/Behavioral: Negative.      Objective:  Physical Exam Constitutional:      Appearance: She is well-developed.  HENT:     Head: Normocephalic and atraumatic.     Comments: Hair thinning especially in the middle of scalp without lesion Cardiovascular:     Rate and Rhythm: Normal rate and regular rhythm.  Pulmonary:     Effort: Pulmonary effort is normal. No respiratory distress.     Breath sounds: Normal breath sounds. No wheezing or rales.  Abdominal:     General: Bowel sounds are normal. There is no distension.     Palpations: Abdomen is soft.     Tenderness: There is no abdominal tenderness. There is no rebound.  Musculoskeletal:     Cervical back: Normal range of motion.  Skin:    General: Skin is warm and dry.  Neurological:     Mental Status: She is alert and oriented to person, place, and time.     Coordination: Coordination normal.     Vitals:   10/14/22 1104 10/14/22 1119  BP: (!) 148/72 (!) 152/74  Pulse: 69   Temp: 98.2 F (36.8 C)   TempSrc: Oral   SpO2: 98%   Weight: 150 lb 6.4 oz (68.2 kg)   Height: 5\' 5"  (1.651 m)     Assessment & Plan:

## 2022-10-14 NOTE — Patient Instructions (Signed)
We will send in the minoxidil to take for the hair. We are checking the labs and x-ray today.

## 2022-10-15 DIAGNOSIS — L659 Nonscarring hair loss, unspecified: Secondary | ICD-10-CM | POA: Insufficient documentation

## 2022-10-15 DIAGNOSIS — M79672 Pain in left foot: Secondary | ICD-10-CM | POA: Insufficient documentation

## 2022-10-15 NOTE — Assessment & Plan Note (Signed)
Checking lipid panel and taking fenofibrate 54 mg 3 times a week. Adjust as needed.

## 2022-10-15 NOTE — Assessment & Plan Note (Signed)
No injury but pain with walking more and in the evening. Sore to touch around 1st digit. Ordered x-ray left foot to assess for stress fracture.

## 2022-10-15 NOTE — Assessment & Plan Note (Signed)
Checking HgA1c and adjust as needed.  

## 2022-10-15 NOTE — Assessment & Plan Note (Signed)
Checking CMP and BP is at goal at home. Mildly elevated today. Continue candesartan 24 mg daily

## 2022-10-15 NOTE — Assessment & Plan Note (Signed)
She has tried otc topical minoxidil and this has not helped much. She would like to try minoxidil oral 2.5 mg daily which is prescribed today. Counseled it takes several months to help and typically patients see more than normal hair loss with stopping if they decide to stop.

## 2022-10-17 ENCOUNTER — Encounter: Payer: Self-pay | Admitting: Internal Medicine

## 2022-11-02 DIAGNOSIS — E041 Nontoxic single thyroid nodule: Secondary | ICD-10-CM | POA: Diagnosis not present

## 2022-11-02 DIAGNOSIS — E042 Nontoxic multinodular goiter: Secondary | ICD-10-CM | POA: Diagnosis not present

## 2022-11-15 DIAGNOSIS — Z961 Presence of intraocular lens: Secondary | ICD-10-CM | POA: Diagnosis not present

## 2022-11-15 DIAGNOSIS — H5211 Myopia, right eye: Secondary | ICD-10-CM | POA: Diagnosis not present

## 2022-12-02 DIAGNOSIS — L658 Other specified nonscarring hair loss: Secondary | ICD-10-CM | POA: Diagnosis not present

## 2022-12-02 DIAGNOSIS — Z872 Personal history of diseases of the skin and subcutaneous tissue: Secondary | ICD-10-CM | POA: Diagnosis not present

## 2022-12-02 DIAGNOSIS — D2270 Melanocytic nevi of unspecified lower limb, including hip: Secondary | ICD-10-CM | POA: Diagnosis not present

## 2022-12-02 DIAGNOSIS — D225 Melanocytic nevi of trunk: Secondary | ICD-10-CM | POA: Diagnosis not present

## 2022-12-02 DIAGNOSIS — Z85828 Personal history of other malignant neoplasm of skin: Secondary | ICD-10-CM | POA: Diagnosis not present

## 2022-12-02 DIAGNOSIS — L608 Other nail disorders: Secondary | ICD-10-CM | POA: Diagnosis not present

## 2022-12-02 DIAGNOSIS — D2260 Melanocytic nevi of unspecified upper limb, including shoulder: Secondary | ICD-10-CM | POA: Diagnosis not present

## 2022-12-28 ENCOUNTER — Other Ambulatory Visit: Payer: Self-pay | Admitting: Internal Medicine

## 2023-02-18 ENCOUNTER — Telehealth: Payer: Self-pay

## 2023-02-18 ENCOUNTER — Encounter: Payer: Self-pay | Admitting: Orthopedic Surgery

## 2023-02-18 ENCOUNTER — Other Ambulatory Visit (INDEPENDENT_AMBULATORY_CARE_PROVIDER_SITE_OTHER): Payer: Self-pay

## 2023-02-18 ENCOUNTER — Ambulatory Visit: Payer: Medicare PPO | Admitting: Orthopedic Surgery

## 2023-02-18 DIAGNOSIS — M25562 Pain in left knee: Secondary | ICD-10-CM

## 2023-02-18 NOTE — Progress Notes (Signed)
Office Visit Note   Patient: [name]           Date of Birth: 11-04-1940           MRN: [id number] Visit Date: 02/18/2023 Requested by: Myrlene Broker, MD [id number] [place],  [place] [address] PCP: Myrlene Broker, MD  Subjective: Chief Complaint  Patient presents with   Knee Pain    HPI: Amberley Hamler is a 82 y.o. female who presents to the office reporting left knee pain.  This has been on and off for 5 years.  The pain does wake her from sleep at night.  Has had no prior surgery to the left knee.  Does report swelling and stiffness along with some weakness.  She feels like there are "bones rubbing together" in that left knee.  Most of her pain is lateral.  She has been taking eggshell membrane for 1 week to help with the pain and it is helping.  Symptoms started when she was working in her vegetable garden.  It hurts her when she moves her leg in certain manner but if she does not move her leg much there is no pain.  She cannot take steroids because of prior reaction with insomnia and increased glucose..                ROS: All systems reviewed are negative as they relate to the chief complaint within the history of present illness.  Patient denies fevers or chills.  Assessment & Plan: Visit Diagnoses:  1. Left knee pain, unspecified chronicity     Plan: Impression is moderate lateral compartment left knee arthritis.  She does have an effusion today.  We talked at great length today about nonoperative treatment options.  In general today we could consider left knee aspiration with Toradol injection.  Would hesitate to inject the knee with cortisone based on her prior reaction.  Additionally we could try to preapproved for for hyaluronic acid injection in the knee with aspiration also performed at the same time.  I do not think this is bad enough yet for knee replacement.  Arthroscopic intervention not indicated based on the arthritis present in the lateral  compartment.  We will preapproved for gel injection and she will consider her options after researching more about them.  All questions answered This patient is diagnosed with osteoarthritis of the knee(s).    Radiographs show evidence of joint space narrowing, osteophytes, subchondral sclerosis and/or subchondral cysts.  This patient has knee pain which interferes with functional and activities of daily living.    This patient has experienced inadequate response, adverse effects and/or intolerance with conservative treatments such as acetaminophen, NSAIDS, topical creams, physical therapy or regular exercise, knee bracing and/or weight loss.   This patient has experienced inadequate response or has a contraindication to intra articular steroid injections for at least 3 months.   This patient is not scheduled to have a total knee replacement within 6 months of starting treatment with viscosupplementation.   Follow-Up Instructions: No follow-ups on file.   Orders:  Orders Placed This Encounter  Procedures   XR KNEE 3 VIEW LEFT   No orders of the defined types were placed in this encounter.     Procedures: No procedures performed   Clinical Data: No additional findings.  Objective: Vital Signs: There were no vitals taken for this visit.  Physical Exam:  Constitutional: Patient appears well-developed HEENT:  Head: Normocephalic Eyes:EOM are normal Neck:  Normal range of motion Cardiovascular: Normal rate Pulmonary/chest: Effort normal Neurologic: Patient is alert Skin: Skin is warm Psychiatric: Patient has normal mood and affect  Ortho Exam: Ortho exam demonstrates normal gait alignment.  Patient walks without assistive devices.  Mild effusion present left knee no effusion right knee.  Range of motion full and symmetric bilaterally with stable collateral and cruciate ligaments on the left.  She does have patellofemoral crepitus on both sides.  Pedal pulses palpable.  Slight  valgus alignment noted.  Quad tone reasonable bilaterally.  Specialty Comments:  No specialty comments available.  Imaging: No results found.   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   Nephrolithiasis 02/24/2018   Kidney stone 02/24/2018   Carotid bruit 02/24/2018   Impaired fasting glucose 02/24/2018   Insomnia 02/24/2018   Other specified disorders of bone density and structure, other site 02/24/2018   Coronary artery disease 02/24/2018   Essential hypertension 05/23/2015   Hemorrhoids 06/12/2012   CORONARY ATHEROSCLEROSIS NATIVE CORONARY ARTERY 11/12/2009   Dyslipidemia 11/11/2009   Past Medical History:  Diagnosis Date   Arthritis    GERD (gastroesophageal reflux disease)    High cholesterol    Hypertension    Osteoporosis     Family History  Problem Relation Age of Onset   Liver cancer Other    Diabetes Mother     Past Surgical History:  Procedure Laterality Date   ABDOMINAL HYSTERECTOMY     BREAST BIOPSY Left    BREAST EXCISIONAL BIOPSY Right    Social History   Occupational History   Not on file  Tobacco Use   Smoking status: Never   Smokeless tobacco: Never  Vaping Use   Vaping status: Never Used  Substance and Sexual Activity   Alcohol use: No   Drug use: No   Sexual activity: Not Currently

## 2023-02-18 NOTE — Telephone Encounter (Signed)
Auth needed for left knee gel 

## 2023-02-18 NOTE — Telephone Encounter (Signed)
VOB submitted for Monovisc, left knee 

## 2023-02-21 ENCOUNTER — Ambulatory Visit: Payer: Medicare PPO | Admitting: Internal Medicine

## 2023-02-24 ENCOUNTER — Other Ambulatory Visit: Payer: Self-pay | Admitting: Internal Medicine

## 2023-02-25 ENCOUNTER — Ambulatory Visit: Payer: Medicare PPO | Admitting: Internal Medicine

## 2023-03-03 ENCOUNTER — Ambulatory Visit: Payer: Medicare PPO | Admitting: Internal Medicine

## 2023-03-03 ENCOUNTER — Encounter: Payer: Self-pay | Admitting: Internal Medicine

## 2023-03-03 VITALS — BP 156/68 | HR 77 | Temp 98.6°F | Ht 65.0 in | Wt 149.0 lb

## 2023-03-03 DIAGNOSIS — I10 Essential (primary) hypertension: Secondary | ICD-10-CM | POA: Diagnosis not present

## 2023-03-03 DIAGNOSIS — R7301 Impaired fasting glucose: Secondary | ICD-10-CM

## 2023-03-03 DIAGNOSIS — R0789 Other chest pain: Secondary | ICD-10-CM | POA: Diagnosis not present

## 2023-03-03 DIAGNOSIS — R0989 Other specified symptoms and signs involving the circulatory and respiratory systems: Secondary | ICD-10-CM | POA: Diagnosis not present

## 2023-03-03 DIAGNOSIS — E785 Hyperlipidemia, unspecified: Secondary | ICD-10-CM | POA: Diagnosis not present

## 2023-03-03 LAB — COMPREHENSIVE METABOLIC PANEL
ALT: 13 U/L (ref 0–35)
AST: 19 U/L (ref 0–37)
Albumin: 4.3 g/dL (ref 3.5–5.2)
Alkaline Phosphatase: 46 U/L (ref 39–117)
BUN: 19 mg/dL (ref 6–23)
CO2: 31 meq/L (ref 19–32)
Calcium: 9.5 mg/dL (ref 8.4–10.5)
Chloride: 101 meq/L (ref 96–112)
Creatinine, Ser: 0.68 mg/dL (ref 0.40–1.20)
GFR: 82.46 mL/min (ref >60.00)
Glucose, Bld: 97 mg/dL (ref 70–99)
Potassium: 4.2 meq/L (ref 3.5–5.1)
Sodium: 140 meq/L (ref 135–145)
Total Bilirubin: 0.5 mg/dL (ref 0.2–1.2)
Total Protein: 7.3 g/dL (ref 6.0–8.3)

## 2023-03-03 LAB — LIPID PANEL
Cholesterol: 286 mg/dL — ABNORMAL HIGH (ref 0–200)
HDL: 54 mg/dL (ref >39.00)
LDL Cholesterol: 180 mg/dL — ABNORMAL HIGH (ref 0–99)
NonHDL: 231.64
Total CHOL/HDL Ratio: 5
Triglycerides: 257 mg/dL — ABNORMAL HIGH (ref 0.0–149.0)
VLDL: 51.4 mg/dL — ABNORMAL HIGH (ref 0.0–40.0)

## 2023-03-03 LAB — HEMOGLOBIN A1C: Hgb A1c MFr Bld: 6.2 % (ref 4.6–6.5)

## 2023-03-03 LAB — CBC
HCT: 40.9 % (ref 36.0–46.0)
Hemoglobin: 13.4 g/dL (ref 12.0–15.0)
MCHC: 32.7 g/dL (ref 30.0–36.0)
MCV: 89.7 fL (ref 78.0–100.0)
Platelets: 278 10*3/uL (ref 150.0–400.0)
RBC: 4.56 Mil/uL (ref 3.87–5.11)
RDW: 13.4 % (ref 11.5–15.5)
WBC: 4.1 10*3/uL (ref 4.0–10.5)

## 2023-03-03 MED ORDER — AMLODIPINE BESYLATE 5 MG PO TABS: 5.0000 mg | ORAL_TABLET | Freq: Every day | ORAL | 3 refills | Status: DC

## 2023-03-03 NOTE — Assessment & Plan Note (Addendum)
Associated with high BP readings. Counseled to go to ER for chest pain and high BP in future. EKG done in office and stable from prior with new 1st degree heart block. We are adjusting her BP regimen.

## 2023-03-03 NOTE — Patient Instructions (Addendum)
We will keep the candesartan in the morning 24 mg. Add amlodipine 5 mg at night time. Use the clonidine as needed for high readings.  We will check the labs today.

## 2023-03-03 NOTE — Progress Notes (Signed)
   Subjective:   Patient ID: [name], female    DOB: 1940/07/05, 82 y.o.   MRN: [id number]  Hypertension Associated symptoms include chest pain and headaches. Pertinent negatives include no palpitations or shortness of breath.   The patient is an 82 YO female coming in for new chest pain 3 days ago while driving and headache. Started while driving home from [place]. Took clonidine and next morning 168/90s. She is not having chest pain today. Still with high BP.   Review of Systems  Constitutional: Negative.   HENT: Negative.    Eyes: Negative.   Respiratory:  Negative for cough, chest tightness and shortness of breath.   Cardiovascular:  Positive for chest pain. Negative for palpitations and leg swelling.  Gastrointestinal:  Negative for abdominal distention, abdominal pain, constipation, diarrhea, nausea and vomiting.  Musculoskeletal: Negative.   Skin: Negative.   Neurological:  Positive for headaches.  Psychiatric/Behavioral: Negative.      Objective:  Physical Exam Constitutional:      Appearance: She is well-developed.  HENT:     Head: Normocephalic and atraumatic.  Cardiovascular:     Rate and Rhythm: Normal rate and regular rhythm.  Pulmonary:     Effort: Pulmonary effort is normal. No respiratory distress.     Breath sounds: Normal breath sounds. No wheezing or rales.  Abdominal:     General: Bowel sounds are normal. There is no distension.     Palpations: Abdomen is soft.     Tenderness: There is no abdominal tenderness. There is no rebound.  Musculoskeletal:     Cervical back: Normal range of motion.  Skin:    General: Skin is warm and dry.  Neurological:     Mental Status: She is alert and oriented to person, place, and time.     Coordination: Coordination normal.   EKG: Rate 72, axis normal, interval 1st degree block prior PR 202 today 220, sinus, no st or t wave changes,  new 1st degree block compared to prior 2022   Vitals:   03/03/23 0900  03/03/23 0902 03/03/23 0952  BP: (!) 200/80 (!) 180/80 (!) 156/68  Pulse: 77    Temp: 98.6 F (37 C)    TempSrc: Oral    SpO2: 99%    Weight: 149 lb (67.6 kg)    Height: 5\' 5"  (1.651 m)      Assessment & Plan:

## 2023-03-03 NOTE — Assessment & Plan Note (Signed)
Stable and getting follow up US carotid bilateral.

## 2023-03-03 NOTE — Assessment & Plan Note (Addendum)
BP moderately elevated today with new headaches and chest pains. EKG done and stable. Adding amlodipine 5 mg daily and keep candesartan 24 mg daily. She would like to transition to telmisartan from candesartan once stable BP. Checking CBC and CMP.

## 2023-03-03 NOTE — Assessment & Plan Note (Addendum)
Checking lipid panel as she has made dietary changes. Taking fenofibrate 54 mg 3 times a week and lipitor 5 mg 3 times a week.

## 2023-03-03 NOTE — Assessment & Plan Note (Signed)
Checking HGA1c and adjust as needed.

## 2023-03-04 ENCOUNTER — Encounter: Payer: Self-pay | Admitting: Internal Medicine

## 2023-03-14 ENCOUNTER — Ambulatory Visit (HOSPITAL_COMMUNITY)
Admission: RE | Admit: 2023-03-14 | Discharge: 2023-03-14 | Disposition: A | Discharge status: Home / Self Care | Payer: Medicare PPO | Source: Ambulatory Visit | Attending: Internal Medicine | Admitting: Internal Medicine

## 2023-03-14 DIAGNOSIS — R0989 Other specified symptoms and signs involving the circulatory and respiratory systems: Secondary | ICD-10-CM | POA: Insufficient documentation

## 2023-03-15 ENCOUNTER — Ambulatory Visit: Payer: Medicare PPO | Admitting: Internal Medicine

## 2023-03-15 ENCOUNTER — Encounter: Payer: Self-pay | Admitting: Internal Medicine

## 2023-03-15 VITALS — BP 160/80 | HR 85 | Temp 98.5°F | Ht 65.0 in | Wt 150.0 lb

## 2023-03-15 DIAGNOSIS — I10 Essential (primary) hypertension: Secondary | ICD-10-CM

## 2023-03-15 DIAGNOSIS — E785 Hyperlipidemia, unspecified: Secondary | ICD-10-CM

## 2023-03-15 MED ORDER — AMLODIPINE BESYLATE 2.5 MG PO TABS: 2.5000 mg | ORAL_TABLET | Freq: Every day | ORAL | 3 refills | Status: DC

## 2023-03-15 NOTE — Progress Notes (Unsigned)
   Subjective:   Patient ID: [name], female    DOB: [birth year], 82 y.o.   MRN: [id number]  HPI The patient is an 82 YO female coming in for BP follow up and headache. This has improved since BP is running better at home with changes. She is taking amlodipine 2.5 mg daily and candesartan 24 mg daily and clonidine prn only (used a few times in last week). Some pain in left thumb.   Review of Systems  Constitutional: Negative.   HENT: Negative.    Eyes: Negative.   Respiratory:  Negative for cough, chest tightness and shortness of breath.   Cardiovascular:  Negative for chest pain, palpitations and leg swelling.  Gastrointestinal:  Negative for abdominal distention, abdominal pain, constipation, diarrhea, nausea and vomiting.  Musculoskeletal:  Positive for arthralgias.  Skin: Negative.   Neurological:  Positive for headaches.  Psychiatric/Behavioral: Negative.      Objective:  Physical Exam Constitutional:      Appearance: She is well-developed.  HENT:     Head: Normocephalic and atraumatic.  Cardiovascular:     Rate and Rhythm: Normal rate and regular rhythm.  Pulmonary:     Effort: Pulmonary effort is normal. No respiratory distress.     Breath sounds: Normal breath sounds. No wheezing or rales.  Abdominal:     General: Bowel sounds are normal. There is no distension.     Palpations: Abdomen is soft.     Tenderness: There is no abdominal tenderness. There is no rebound.  Musculoskeletal:        General: Tenderness present.     Cervical back: Normal range of motion.  Skin:    General: Skin is warm and dry.  Neurological:     Mental Status: She is alert and oriented to person, place, and time.     Coordination: Coordination normal.     Vitals:   03/15/23 1421 03/15/23 1425  BP: (!) 160/80 (!) 160/80  Pulse: 85   Temp: 98.5 F (36.9 C)   TempSrc: Oral   SpO2: 95%   Weight: 150 lb (68 kg)   Height: 5\' 5"  (1.651 m)     Assessment & Plan:

## 2023-03-15 NOTE — Patient Instructions (Signed)
We have sent in the amlodipine 2.5 mg daily

## 2023-03-17 ENCOUNTER — Encounter: Payer: Self-pay | Admitting: Internal Medicine

## 2023-03-17 NOTE — Assessment & Plan Note (Signed)
She was having low BP on amlodipine 5 mg daily so reduced to 2.5 mg daily. New rx done. Keep candesartan 24 mg daily and plans to change to telmisartan in near future. Using clonidine 0.1 mg prn for high BP and okay with that but if often needs change in regimen. Ordered labs to check in 1 month.

## 2023-03-17 NOTE — Assessment & Plan Note (Signed)
Had stopped lipitor due to side effects. She will change fenofibrate to 54 mg daily and recheck lipids in 1 month. Ordered placed.

## 2023-03-25 ENCOUNTER — Other Ambulatory Visit: Payer: Self-pay | Admitting: Internal Medicine

## 2023-03-29 DIAGNOSIS — C44311 Basal cell carcinoma of skin of nose: Secondary | ICD-10-CM | POA: Diagnosis not present

## 2023-04-07 DIAGNOSIS — I781 Nevus, non-neoplastic: Secondary | ICD-10-CM | POA: Diagnosis not present

## 2023-04-07 DIAGNOSIS — L905 Scar conditions and fibrosis of skin: Secondary | ICD-10-CM | POA: Diagnosis not present

## 2023-04-27 ENCOUNTER — Encounter: Payer: Self-pay | Admitting: Internal Medicine

## 2023-05-04 ENCOUNTER — Other Ambulatory Visit (INDEPENDENT_AMBULATORY_CARE_PROVIDER_SITE_OTHER): Payer: Medicare PPO

## 2023-05-04 ENCOUNTER — Encounter: Payer: Self-pay | Admitting: Internal Medicine

## 2023-05-04 DIAGNOSIS — E785 Hyperlipidemia, unspecified: Secondary | ICD-10-CM | POA: Diagnosis not present

## 2023-05-04 LAB — CBC
HCT: 38.6 % (ref 36.0–46.0)
Hemoglobin: 13 g/dL (ref 12.0–15.0)
MCHC: 33.6 g/dL (ref 30.0–36.0)
MCV: 90.5 fL (ref 78.0–100.0)
Platelets: 269 10*3/uL (ref 150.0–400.0)
RBC: 4.27 Mil/uL (ref 3.87–5.11)
RDW: 13.7 % (ref 11.5–15.5)
WBC: 5.5 10*3/uL (ref 4.0–10.5)

## 2023-05-04 LAB — LIPID PANEL
Cholesterol: 249 mg/dL — ABNORMAL HIGH (ref 0–200)
HDL: 50.9 mg/dL (ref >39.00)
LDL Cholesterol: 137 mg/dL — ABNORMAL HIGH (ref 0–99)
NonHDL: 197.64
Total CHOL/HDL Ratio: 5
Triglycerides: 303 mg/dL — ABNORMAL HIGH (ref 0.0–149.0)
VLDL: 60.6 mg/dL — ABNORMAL HIGH (ref 0.0–40.0)

## 2023-05-04 LAB — COMPREHENSIVE METABOLIC PANEL
ALT: 12 U/L (ref 0–35)
AST: 19 U/L (ref 0–37)
Albumin: 4.3 g/dL (ref 3.5–5.2)
Alkaline Phosphatase: 45 U/L (ref 39–117)
BUN: 18 mg/dL (ref 6–23)
CO2: 31 meq/L (ref 19–32)
Calcium: 9.4 mg/dL (ref 8.4–10.5)
Chloride: 103 meq/L (ref 96–112)
Creatinine, Ser: 0.67 mg/dL (ref 0.40–1.20)
GFR: 82.66 mL/min (ref >60.00)
Glucose, Bld: 89 mg/dL (ref 70–99)
Potassium: 4.7 meq/L (ref 3.5–5.1)
Sodium: 140 meq/L (ref 135–145)
Total Bilirubin: 0.5 mg/dL (ref 0.2–1.2)
Total Protein: 7.2 g/dL (ref 6.0–8.3)

## 2023-05-16 ENCOUNTER — Encounter: Payer: Self-pay | Admitting: Internal Medicine

## 2023-05-16 ENCOUNTER — Ambulatory Visit: Payer: Medicare PPO | Admitting: Internal Medicine

## 2023-05-16 VITALS — BP 160/80 | HR 81 | Temp 98.2°F | Ht 65.0 in | Wt 150.0 lb

## 2023-05-16 DIAGNOSIS — R7301 Impaired fasting glucose: Secondary | ICD-10-CM

## 2023-05-16 DIAGNOSIS — E785 Hyperlipidemia, unspecified: Secondary | ICD-10-CM | POA: Diagnosis not present

## 2023-05-16 DIAGNOSIS — F5101 Primary insomnia: Secondary | ICD-10-CM | POA: Diagnosis not present

## 2023-05-16 DIAGNOSIS — I10 Essential (primary) hypertension: Secondary | ICD-10-CM

## 2023-05-16 DIAGNOSIS — R350 Frequency of micturition: Secondary | ICD-10-CM

## 2023-05-16 LAB — POCT URINALYSIS DIPSTICK
Bilirubin, UA: NEGATIVE
Blood, UA: NEGATIVE
Glucose, UA: NEGATIVE
Ketones, UA: NEGATIVE
Leukocytes, UA: NEGATIVE
Nitrite, UA: NEGATIVE
Protein, UA: NEGATIVE
Spec Grav, UA: 1.015 (ref 1.010–1.025)
Urobilinogen, UA: NEGATIVE U/dL — AB
pH, UA: 7 (ref 5.0–8.0)

## 2023-05-16 MED ORDER — ESTRADIOL 0.1 MG/GM VA CREA: 1.0000 | TOPICAL_CREAM | Freq: Every day | VAGINAL | 12 refills | Status: AC

## 2023-05-16 MED ORDER — FENOFIBRATE 54 MG PO TABS: 54.0000 mg | ORAL_TABLET | Freq: Every day | ORAL | 3 refills | Status: DC

## 2023-05-16 NOTE — Patient Instructions (Addendum)
Call the cxbladder to see if there are any providers in the area who can order this.  We will try the estrogen cream to help with the sleep.  We can check the labs in about 1 month to check cholesterol and sugars and kidneys and liver.

## 2023-05-17 ENCOUNTER — Encounter: Payer: Self-pay | Admitting: Internal Medicine

## 2023-05-17 DIAGNOSIS — R350 Frequency of micturition: Secondary | ICD-10-CM | POA: Insufficient documentation

## 2023-05-17 NOTE — Assessment & Plan Note (Signed)
She attributes recent troubles with sleeping to stopping estrogen supplement/cream. Rx topical estrace 0.1 % cream to use and this could take a few months to get fully in her system. She has tried and failed trazodone in the past.

## 2023-05-17 NOTE — Assessment & Plan Note (Signed)
Due for recheck HgA1c in 1 month orders placed for future.

## 2023-05-17 NOTE — Assessment & Plan Note (Signed)
She has now been taking fenofibrate 54 mg daily for a few weeks and will order lipid panel to do in 1-2 months.

## 2023-05-17 NOTE — Assessment & Plan Note (Signed)
BP at goal at home readings here mildly elevated. She will continue on amlodipine 2.5 mg daily and candesartan 24 mg daily.

## 2023-05-17 NOTE — Assessment & Plan Note (Signed)
 U/A done to rule out infection POC and was negative for signs of infection. She is asking about cx bladder and looked this up it is a test for hematuria which she does not have. She would like to consider paying out of pocket to help rule out cancer as she has been using hair dyes for 30 years. I do not think I can order this. Suspect stopping estrogen supplement has affected her urination and we will resume. Rx estrace  0.1% cream.

## 2023-05-21 ENCOUNTER — Other Ambulatory Visit: Payer: Self-pay | Admitting: Internal Medicine

## 2023-05-24 ENCOUNTER — Ambulatory Visit: Payer: Medicare PPO | Admitting: Internal Medicine

## 2023-05-24 ENCOUNTER — Encounter: Payer: Self-pay | Admitting: Internal Medicine

## 2023-05-24 VITALS — BP 180/80 | HR 83 | Temp 98.5°F | Ht 65.0 in | Wt 148.0 lb

## 2023-05-24 DIAGNOSIS — R5383 Other fatigue: Secondary | ICD-10-CM | POA: Diagnosis not present

## 2023-05-24 DIAGNOSIS — R1011 Right upper quadrant pain: Secondary | ICD-10-CM | POA: Diagnosis not present

## 2023-05-24 NOTE — Assessment & Plan Note (Signed)
 Checking U/A and RUQ [place] to assess. Reassurance given that recent LFTs normal.

## 2023-05-24 NOTE — Assessment & Plan Note (Signed)
 With upcoming labs in a few weeks checking TSH, B12, vitamin D, ferritin to assess new fatigue. It may be coming from excessive urination at night time causing disrupted sleep.

## 2023-05-24 NOTE — Progress Notes (Signed)
   Subjective:   Patient ID: [name], female    DOB: 05-Aug-1940, 83 y.o.   MRN: [id number]  HPI The patient is an 83 YO female coming in for right upper quadrant discomfort and concerns about liver function. Sister with liver cancer causing death and mom with cirrhosis and diabetes resulted in death. She had recent urine studies done which need to be repeated.   Review of Systems  Constitutional: Negative.   HENT: Negative.    Eyes: Negative.   Respiratory:  Negative for cough, chest tightness and shortness of breath.   Cardiovascular:  Negative for chest pain, palpitations and leg swelling.  Gastrointestinal:  Positive for abdominal pain. Negative for abdominal distention, constipation, diarrhea, nausea and vomiting.  Musculoskeletal: Negative.   Skin: Negative.   Neurological: Negative.   Psychiatric/Behavioral: Negative.      Objective:  Physical Exam Constitutional:      Appearance: She is well-developed.  HENT:     Head: Normocephalic and atraumatic.  Cardiovascular:     Rate and Rhythm: Normal rate and regular rhythm.  Pulmonary:     Effort: Pulmonary effort is normal. No respiratory distress.     Breath sounds: Normal breath sounds. No wheezing or rales.  Abdominal:     General: Bowel sounds are normal. There is no distension.     Palpations: Abdomen is soft.     Tenderness: There is abdominal tenderness. There is no rebound.  Musculoskeletal:     Cervical back: Normal range of motion.  Skin:    General: Skin is warm and dry.  Neurological:     Mental Status: She is alert and oriented to person, place, and time.     Coordination: Coordination normal.     Vitals:   05/24/23 1504 05/24/23 1512  BP: (!) 180/80 (!) 180/80  Pulse: 83   Temp: 98.5 F (36.9 C)   TempSrc: Oral   SpO2: 98%   Weight: 148 lb (67.1 kg)   Height: 5' 5 (1.651 m)     Assessment & Plan:  Visit time 25 minutes in face to face communication with patient and coordination of care,  additional 5 minutes spent in record review, coordination or care, ordering tests, communicating/referring to other healthcare professionals, documenting in medical records all on the same day of the visit for total time 30 minutes spent on the visit.

## 2023-05-24 NOTE — Patient Instructions (Signed)
 We will check the urine again and the ultrasound of the liver and gallbladder.

## 2023-05-25 LAB — URINALYSIS, ROUTINE W REFLEX MICROSCOPIC
Bilirubin Urine: NEGATIVE
Hgb urine dipstick: NEGATIVE
Ketones, ur: NEGATIVE
Leukocytes,Ua: NEGATIVE
Nitrite: NEGATIVE
RBC / HPF: NONE SEEN (ref 0–?)
Specific Gravity, Urine: 1.015 (ref 1.000–1.030)
Total Protein, Urine: NEGATIVE
Urine Glucose: NEGATIVE
Urobilinogen, UA: 0.2 (ref 0.0–1.0)
pH: 7 (ref 5.0–8.0)

## 2023-05-26 ENCOUNTER — Encounter: Payer: Self-pay | Admitting: Internal Medicine

## 2023-06-03 DIAGNOSIS — L821 Other seborrheic keratosis: Secondary | ICD-10-CM | POA: Diagnosis not present

## 2023-06-03 DIAGNOSIS — L820 Inflamed seborrheic keratosis: Secondary | ICD-10-CM | POA: Diagnosis not present

## 2023-06-03 DIAGNOSIS — L700 Acne vulgaris: Secondary | ICD-10-CM | POA: Diagnosis not present

## 2023-07-28 ENCOUNTER — Other Ambulatory Visit: Payer: Self-pay | Admitting: Internal Medicine

## 2023-08-16 ENCOUNTER — Ambulatory Visit (INDEPENDENT_AMBULATORY_CARE_PROVIDER_SITE_OTHER): Payer: Medicare PPO

## 2023-08-16 VITALS — Ht 65.0 in | Wt 148.0 lb

## 2023-08-16 DIAGNOSIS — Z0000 Encounter for general adult medical examination without abnormal findings: Secondary | ICD-10-CM | POA: Diagnosis not present

## 2023-08-16 DIAGNOSIS — Z780 Asymptomatic menopausal state: Secondary | ICD-10-CM | POA: Diagnosis not present

## 2023-08-16 NOTE — Patient Instructions (Signed)
 [name] , Thank you for taking time to come for your Medicare Wellness Visit. I appreciate your ongoing commitment to your health goals. Please review the following plan we discussed and let me know if I can assist you in the future.   Referrals/Orders/Follow-Ups/Clinician Recommendations: It was very nice talking with you today.  You are due for a Tetanus vaccine.  Keep up the good work.  You have an order for:   [x]   3D Mammogram  [x]   Bone Density     Please call for appointment:  [facility] [address] [place], [place] [address] [phone]  Make sure to wear two-piece clothing.  No lotions, powders, or deodorants the day of the appointment. Make sure to bring picture ID and insurance card.  Bring list of medications you are currently taking including any supplements.    This is a list of the screening recommended for you and due dates:  Health Maintenance  Topic Date Due   DTaP/Tdap/Td vaccine (1 - Tdap) Never done   COVID-19 Vaccine (5 - 2024-25 season) 01/16/2023   Flu Shot  12/16/2023   Medicare Annual Wellness Visit  08/15/2024   Pneumonia Vaccine  Completed   DEXA scan (bone density measurement)  Completed   Zoster (Shingles) Vaccine  Completed   HPV Vaccine  Aged Out    Advanced directives: (Copy Requested) Please bring a copy of your health care power of attorney and living will to the office to be added to your chart at your convenience. You can mail to [facility] [address] [place], [place] [address] or email to [email or web address]  Next Medicare Annual Wellness Visit scheduled for next year: Yes

## 2023-08-16 NOTE — Progress Notes (Signed)
 Subjective:   [name] is a 83 y.o. who presents for a Medicare Wellness preventive visit.  Visit Complete: Virtual I connected with  Prudencio Pair on 08/16/23 by a video and audio enabled telemedicine application and verified that I am speaking with the correct person using two identifiers.  Patient Location: Home  Provider Location: Home Office  I discussed the limitations of evaluation and management by telemedicine. The patient expressed understanding and agreed to proceed.  Vital Signs: Because this visit was a virtual/telehealth visit, some criteria may be missing or patient reported. Any vitals not documented were not able to be obtained and vitals that have been documented are patient reported.   Persons Participating in Visit: Patient.  AWV Questionnaire: No: Patient Medicare AWV questionnaire was not completed prior to this visit.  Cardiac Risk Factors include: advanced age (>50men, >43 women);Other (see comment);hypertension;dyslipidemia, Risk factor comments: Coronary artery disease     Objective:    Today's Vitals   08/16/23 1120 08/16/23 1123  Weight: 148 lb (67.1 kg)   Height: 5\' 5"  (1.651 m)   PainSc:  5    Body mass index is 24.63 kg/m.     08/16/2023   11:37 AM 03/19/2022    1:02 PM 04/24/2020    2:39 AM 03/17/2020    5:57 PM 10/30/2015    1:42 PM  Advanced Directives  Does Patient Have a Medical Advance Directive? Yes No No No No  Type of Estate agent of [place];Living will      Copy of Healthcare Power of Attorney in Chart? No - copy requested      Would patient like information on creating a medical advance directive?  No - Patient declined No - Patient declined No - Patient declined No - patient declined information    Current Medications (verified) Outpatient Encounter Medications as of 08/16/2023  Medication Sig   amLODipine (NORVASC) 2.5 MG tablet Take 1 tablet (2.5 mg total) by mouth daily.   ascorbic acid (VITAMIN  C) 500 MG tablet Take by mouth.   candesartan (ATACAND) 16 MG tablet TAKE 1 TABLET BY MOUTH EVERY DAY   candesartan (ATACAND) 4 MG tablet TAKE 1 TAB BY MOUTH IN THE MORNING & AT BEDTIME. TAKE WITH 16 MG FOR TOTAL DAILY DOSE 24 MG   Cholecalciferol 25 MCG (1000 UT) tablet Take 1,000 Units by mouth daily.   cloNIDine (CATAPRES) 0.1 MG tablet TAKE 1 TABLET BY MOUTH EVERY DAY AS NEEDED *IF BP>180 OR DIASTOLIIC>100   estradiol (ESTRACE VAGINAL) 0.1 MG/GM vaginal cream Place 1 Applicatorful vaginally at bedtime.   fenofibrate 54 MG tablet Take 1 tablet (54 mg total) by mouth daily.   Magnesium 200 MG TABS Take 400 mg by mouth daily.   Magnesium Oxide -Mg Supplement 200 MG TABS Take by mouth.   Multiple Vitamin (MULITIVITAMIN WITH MINERALS) TABS Take 1 tablet by mouth daily.   No facility-administered encounter medications on file as of 08/16/2023.    Allergies (verified) Crestor [rosuvastatin], Lipitor [atorvastatin], Epinephrine, Ioxaglate, and Ivp dye [iodinated contrast media]   History: Past Medical History:  Diagnosis Date   Arthritis    GERD (gastroesophageal reflux disease)    High cholesterol    Hypertension    Osteoporosis    Past Surgical History:  Procedure Laterality Date   ABDOMINAL HYSTERECTOMY     BREAST BIOPSY Left    BREAST EXCISIONAL BIOPSY Right    Family History  Problem Relation Age of Onset   Liver  cancer Other    Diabetes Mother    Social History   Socioeconomic History   Marital status: Married    Spouse name: Dory Peru   Number of children: 3   Years of education: masters   Highest education level: Master's degree (e.g., MA, MS, MEng, MEd, MSW, MBA)  Occupational History   Not on file  Tobacco Use   Smoking status: Never   Smokeless tobacco: Never  Vaping Use   Vaping status: Never Used  Substance and Sexual Activity   Alcohol use: No   Drug use: No   Sexual activity: Not Currently  Other Topics Concern   Not on file  Social History Narrative    Lives with her husband and 1 dog.   Social Drivers of Corporate investment banker Strain: Low Risk  (08/16/2023)   Overall Financial Resource Strain (CARDIA)    Difficulty of Paying Living Expenses: Not hard at all  Food Insecurity: No Food Insecurity (08/16/2023)   Hunger Vital Sign    Worried About Running Out of Food in the Last Year: Never true    Ran Out of Food in the Last Year: Never true  Transportation Needs: No Transportation Needs (08/16/2023)   PRAPARE - Administrator, Civil Service (Medical): No    Lack of Transportation (Non-Medical): No  Physical Activity: Insufficiently Active (08/16/2023)   Exercise Vital Sign    Days of Exercise per Week: 7 days    Minutes of Exercise per Session: 20 min  Stress: No Stress Concern Present (08/16/2023)   [employer] of Occupational Health - Occupational Stress Questionnaire    Feeling of Stress : Not at all  Social Connections: Socially Integrated (08/16/2023)   Social Connection and Isolation Panel [NHANES]    Frequency of Communication with Friends and Family: More than three times a week    Frequency of Social Gatherings with Friends and Family: Once a week    Attends Religious Services: More than 4 times per year    Active Member of [employer] or Organizations: Yes    Attends Banker Meetings: 1 to 4 times per year    Marital Status: Married    Tobacco Counseling Counseling given: Not Answered    Clinical Intake:  Pre-visit preparation completed: Yes  Pain : 0-10 Pain Score: 5  Pain Type: Chronic pain Pain Location: Knee (lower back) Pain Orientation: Left Pain Descriptors / Indicators: Aching, Discomfort Pain Onset: More than a month ago Pain Frequency: Intermittent Pain Relieving Factors: Heating pad  Pain Relieving Factors: Heating pad  BMI - recorded: 24.63 Nutritional Risks: None Diabetes: No  Lab Results  Component Value Date   HGBA1C 6.2 03/03/2023   HGBA1C 6.2 10/14/2022   HGBA1C  6.3 07/13/2022     How often do you need to have someone help you when you read instructions, pamphlets, or other written materials from your doctor or pharmacy?: 1 - Never  Interpreter Needed?: No  Information entered by :: Tommy Minichiello, RMA   Activities of Daily Living     08/16/2023   11:23 AM  In your present state of health, do you have any difficulty performing the following activities:  Hearing? 0  Vision? 0  Difficulty concentrating or making decisions? 0  Walking or climbing stairs? 0  Dressing or bathing? 0  Doing errands, shopping? 0  Preparing Food and eating ? N  Using the Toilet? N  In the past six months, have you accidently leaked urine? N  Do you have problems with loss of bowel control? N  Managing your Medications? N  Managing your Finances? N  Housekeeping or managing your Housekeeping? N    Patient Care Team: Myrlene Broker, MD as PCP - General (Internal Medicine) Rollene Rotunda, MD as PCP - Cardiology (Cardiology) [place], [facility]  Indicate any recent Medical Services you may have received from other than [facility] providers in the past year (date may be approximate).     Assessment:   This is a routine wellness examination for Rashelle.  Hearing/Vision screen Hearing Screening - Comments:: Denies hearing difficulties   Vision Screening - Comments:: Wears eyeglasses   Goals Addressed             This Visit's Progress    Patient Stated   On track    Continue eat health and exercise.       Depression Screen    08/16/2023   11:42 AM 10/14/2022   11:15 AM 08/11/2022   10:52 AM 07/13/2022   10:32 AM 03/19/2022   12:55 PM 01/12/2022    3:03 PM 10/01/2020   10:06 AM  PHQ 2/9 Scores  PHQ - 2 Score 0 0 0 0 1 0 0  PHQ- 9 Score 2  2 0       Fall Risk     08/16/2023   11:37 AM 05/16/2023   10:28 AM 03/03/2023    9:03 AM 10/14/2022   11:15 AM 08/11/2022   10:52 AM  Fall Risk   Falls in the past year? 0 0 0 0 0  Number  falls in past yr: 0 0 0 0 0  Injury with Fall? 0 0 0 0 0  Risk for fall due to : No Fall Risks   No Fall Risks No Fall Risks  Follow up Falls prevention discussed;Falls evaluation completed Falls evaluation completed Falls evaluation completed Falls evaluation completed Falls evaluation completed    MEDICARE RISK AT HOME:  Medicare Risk at Home Any stairs in or around the home?: Yes If so, are there any without handrails?: Yes Home free of loose throw rugs in walkways, pet beds, electrical cords, etc?: Yes Adequate lighting in your home to reduce risk of falls?: Yes Life alert?: No Use of a cane, walker or w/c?: No Grab bars in the bathroom?: No Shower chair or bench in shower?: Yes Elevated toilet seat or a handicapped toilet?: Yes  TIMED UP AND GO:  Was the test performed?  No  Cognitive Function: 6CIT completed    03/19/2022    1:03 PM  MMSE - Mini Mental State Exam  Not completed: Refused        08/16/2023   11:38 AM  6CIT Screen  What Year? 0 points  What month? 0 points  What time? 0 points  Count back from 20 0 points  Months in reverse 0 points  Repeat phrase 0 points  Total Score 0 points    Immunizations Immunization History  Administered Date(s) Administered   Fluad Trivalent(High Dose 65+) 02/08/2023   Influenza Split 02/03/2012, 02/14/2014, 01/23/2017, 02/05/2019, 02/15/2020   Influenza, High Dose Seasonal PF 02/03/2015, 01/29/2016, 01/26/2018, 02/06/2021, 02/11/2022   Influenza,inj,Quad PF,6+ Mos 03/22/2011   Influenza-Unspecified 03/22/2011, 02/03/2012, 02/06/2013, 02/14/2014, 01/23/2017, 02/05/2019, 02/15/2020, 03/17/2020   Moderna Sars-Covid-2 Vaccination 05/29/2019, 06/26/2019, 03/11/2020   Novavax(Covid-19) Vaccine 05/13/2022   Pneumococcal Conjugate-13 06/08/2013, 03/06/2015   Pneumococcal Polysaccharide-23 06/01/2009   Respiratory Syncytial Virus Vaccine,Recomb Aduvanted(Arexvy) 07/20/2023   Zoster Recombinant(Shingrix) 01/16/2017, 05/25/2017  Zoster, Live 05/17/2006, 01/16/2017, 05/25/2017   Zoster, Unspecified 05/25/2017    Screening Tests Health Maintenance  Topic Date Due   DTaP/Tdap/Td (1 - Tdap) Never done   COVID-19 Vaccine (5 - 2024-25 season) 01/16/2023   INFLUENZA VACCINE  12/16/2023   Medicare Annual Wellness (AWV)  08/15/2024   Pneumonia Vaccine 64+ Years old  Completed   DEXA SCAN  Completed   Zoster Vaccines- Shingrix  Completed   HPV VACCINES  Aged Out    Health Maintenance  Health Maintenance Due  Topic Date Due   DTaP/Tdap/Td (1 - Tdap) Never done   COVID-19 Vaccine (5 - 2024-25 season) 01/16/2023   Health Maintenance Items Addressed: DEXA ordered  Additional Screening:  Vision Screening: Recommended annual ophthalmology exams for early detection of glaucoma and other disorders of the eye.  Dental Screening: Recommended annual dental exams for proper oral hygiene  Community Resource Referral / Chronic Care Management: CRR required this visit?  No   CCM required this visit?  No     Plan:     I have personally reviewed and noted the following in the patient's chart:   Medical and social history Use of alcohol, tobacco or illicit drugs  Current medications and supplements including opioid prescriptions. Patient is not currently taking opioid prescriptions. Functional ability and status Nutritional status Physical activity Advanced directives List of other physicians Hospitalizations, surgeries, and ER visits in previous 12 months Vitals Screenings to include cognitive, depression, and falls Referrals and appointments  In addition, I have reviewed and discussed with patient certain preventive protocols, quality metrics, and best practice recommendations. A written personalized care plan for preventive services as well as general preventive health recommendations were provided to patient.     Nimai Burbach L Chon Buhl, CMA   08/16/2023   After Visit Summary: (MyChart) Due to this being a  telephonic visit, the after visit summary with patients personalized plan was offered to patient via MyChart   Notes: Please refer to Routing Comments.

## 2023-08-22 ENCOUNTER — Ambulatory Visit: Admitting: Internal Medicine

## 2023-08-23 ENCOUNTER — Other Ambulatory Visit: Payer: Self-pay | Admitting: Internal Medicine

## 2023-08-23 DIAGNOSIS — Z1231 Encounter for screening mammogram for malignant neoplasm of breast: Secondary | ICD-10-CM

## 2023-08-26 ENCOUNTER — Encounter: Payer: Self-pay | Admitting: Internal Medicine

## 2023-08-26 ENCOUNTER — Ambulatory Visit: Admitting: Internal Medicine

## 2023-08-26 VITALS — BP 168/98 | HR 74 | Temp 98.0°F | Ht 65.0 in | Wt 150.0 lb

## 2023-08-26 DIAGNOSIS — I10 Essential (primary) hypertension: Secondary | ICD-10-CM

## 2023-08-26 DIAGNOSIS — Z0000 Encounter for general adult medical examination without abnormal findings: Secondary | ICD-10-CM | POA: Diagnosis not present

## 2023-08-26 DIAGNOSIS — R7301 Impaired fasting glucose: Secondary | ICD-10-CM | POA: Diagnosis not present

## 2023-08-26 DIAGNOSIS — E785 Hyperlipidemia, unspecified: Secondary | ICD-10-CM

## 2023-08-26 LAB — CBC
HCT: 41.1 % (ref 36.0–46.0)
Hemoglobin: 13.7 g/dL (ref 12.0–15.0)
MCHC: 33.5 g/dL (ref 30.0–36.0)
MCV: 90.2 fl (ref 78.0–100.0)
Platelets: 270 10*3/uL (ref 150.0–400.0)
RBC: 4.56 Mil/uL (ref 3.87–5.11)
RDW: 13.8 % (ref 11.5–15.5)
WBC: 4.2 10*3/uL (ref 4.0–10.5)

## 2023-08-26 LAB — URINALYSIS, ROUTINE W REFLEX MICROSCOPIC
Bilirubin Urine: NEGATIVE
Hgb urine dipstick: NEGATIVE
Ketones, ur: NEGATIVE
Leukocytes,Ua: NEGATIVE
Nitrite: NEGATIVE
Specific Gravity, Urine: 1.01 (ref 1.000–1.030)
Total Protein, Urine: NEGATIVE
Urine Glucose: NEGATIVE
Urobilinogen, UA: 0.2 (ref 0.0–1.0)
pH: 7 (ref 5.0–8.0)

## 2023-08-26 LAB — COMPREHENSIVE METABOLIC PANEL WITH GFR
ALT: 12 U/L (ref 0–35)
AST: 20 U/L (ref 0–37)
Albumin: 4.8 g/dL (ref 3.5–5.2)
Alkaline Phosphatase: 42 U/L (ref 39–117)
BUN: 18 mg/dL (ref 6–23)
CO2: 30 meq/L (ref 19–32)
Calcium: 9.3 mg/dL (ref 8.4–10.5)
Chloride: 101 meq/L (ref 96–112)
Creatinine, Ser: 0.65 mg/dL (ref 0.40–1.20)
GFR: 83.08 mL/min (ref >60.00)
Glucose, Bld: 96 mg/dL (ref 70–99)
Potassium: 4.3 meq/L (ref 3.5–5.1)
Sodium: 140 meq/L (ref 135–145)
Total Bilirubin: 0.5 mg/dL (ref 0.2–1.2)
Total Protein: 7.5 g/dL (ref 6.0–8.3)

## 2023-08-26 LAB — HEMOGLOBIN A1C: Hgb A1c MFr Bld: 6.2 % (ref 4.6–6.5)

## 2023-08-26 LAB — LIPID PANEL
Cholesterol: 280 mg/dL — ABNORMAL HIGH (ref 0–200)
HDL: 63.8 mg/dL (ref >39.00)
LDL Cholesterol: 173 mg/dL — ABNORMAL HIGH (ref 0–99)
NonHDL: 216.19
Total CHOL/HDL Ratio: 4
Triglycerides: 216 mg/dL — ABNORMAL HIGH (ref 0.0–149.0)
VLDL: 43.2 mg/dL — ABNORMAL HIGH (ref 0.0–40.0)

## 2023-08-26 LAB — TSH: TSH: 1.05 u[IU]/mL (ref 0.35–5.50)

## 2023-08-26 LAB — VITAMIN D 25 HYDROXY (VIT D DEFICIENCY, FRACTURES): VITD: 38.07 ng/mL (ref 30.00–100.00)

## 2023-08-26 NOTE — Progress Notes (Signed)
   Subjective:   Patient ID: [name], female    DOB: [birth year], 83 y.o.   MRN: [id number]  HPI The patient is here for physical. BP running 135/70s mostly.   PMH, [facility], social history reviewed and updated  Review of Systems  Constitutional: Negative.   HENT: Negative.    Eyes: Negative.   Respiratory:  Negative for cough, chest tightness and shortness of breath.   Cardiovascular:  Negative for chest pain, palpitations and leg swelling.  Gastrointestinal:  Negative for abdominal distention, abdominal pain, constipation, diarrhea, nausea and vomiting.  Genitourinary:  Positive for frequency.  Musculoskeletal:  Positive for arthralgias.  Skin: Negative.   Neurological: Negative.   Psychiatric/Behavioral:  Positive for sleep disturbance.     Objective:  Physical Exam Constitutional:      Appearance: She is well-developed.  HENT:     Head: Normocephalic and atraumatic.  Cardiovascular:     Rate and Rhythm: Normal rate and regular rhythm.  Pulmonary:     Effort: Pulmonary effort is normal. No respiratory distress.     Breath sounds: Normal breath sounds. No wheezing or rales.  Abdominal:     General: Bowel sounds are normal. There is no distension.     Palpations: Abdomen is soft.     Tenderness: There is no abdominal tenderness. There is no rebound.  Musculoskeletal:     Cervical back: Normal range of motion.  Skin:    General: Skin is warm and dry.  Neurological:     Mental Status: She is alert and oriented to person, place, and time.     Coordination: Coordination normal.     Vitals:   08/26/23 1020  BP: (!) 168/98  Pulse: 74  Temp: 98 F (36.7 C)  SpO2: 98%  Weight: 150 lb (68 kg)  Height: 5\' 5"  (1.651 m)    Assessment & Plan:

## 2023-08-26 NOTE — Assessment & Plan Note (Signed)
 Checking HGA1c and adjust as needed.

## 2023-08-26 NOTE — Assessment & Plan Note (Signed)
Flu shot yearly. Pneumonia complete. Shingrix complete. Tetanus due at pharmacy. Colonoscopy aged out. Mammogram aged out, pap smear aged out and dexa complete. Counseled about sun safety and mole surveillance. Counseled about the dangers of distracted driving. Given 10 year screening recommendations.

## 2023-08-26 NOTE — Assessment & Plan Note (Signed)
 BP elevated here but normal at home this morning and she is tracking and at goal consistently. Continue candesartan and amlodipine.

## 2023-08-26 NOTE — Assessment & Plan Note (Signed)
 Checking lipid panel and adjust fenofibrate as needed

## 2023-08-27 LAB — URINE CULTURE: Result:: NO GROWTH

## 2023-08-29 ENCOUNTER — Encounter: Payer: Self-pay | Admitting: Internal Medicine

## 2023-08-30 DIAGNOSIS — L821 Other seborrheic keratosis: Secondary | ICD-10-CM | POA: Diagnosis not present

## 2023-08-30 DIAGNOSIS — Z85828 Personal history of other malignant neoplasm of skin: Secondary | ICD-10-CM | POA: Diagnosis not present

## 2023-08-30 DIAGNOSIS — L639 Alopecia areata, unspecified: Secondary | ICD-10-CM | POA: Diagnosis not present

## 2023-08-30 DIAGNOSIS — L658 Other specified nonscarring hair loss: Secondary | ICD-10-CM | POA: Diagnosis not present

## 2023-08-30 DIAGNOSIS — L578 Other skin changes due to chronic exposure to nonionizing radiation: Secondary | ICD-10-CM | POA: Diagnosis not present

## 2023-08-30 DIAGNOSIS — L608 Other nail disorders: Secondary | ICD-10-CM | POA: Diagnosis not present

## 2023-09-23 ENCOUNTER — Encounter

## 2023-09-23 DIAGNOSIS — Z1231 Encounter for screening mammogram for malignant neoplasm of breast: Secondary | ICD-10-CM

## 2023-09-26 ENCOUNTER — Other Ambulatory Visit: Payer: Self-pay | Admitting: Internal Medicine

## 2023-09-26 DIAGNOSIS — Z1231 Encounter for screening mammogram for malignant neoplasm of breast: Secondary | ICD-10-CM

## 2023-09-28 ENCOUNTER — Ambulatory Visit
Admission: RE | Admit: 2023-09-28 | Discharge: 2023-09-28 | Disposition: A | Discharge status: Home / Self Care | Source: Ambulatory Visit

## 2023-09-28 DIAGNOSIS — Z1231 Encounter for screening mammogram for malignant neoplasm of breast: Secondary | ICD-10-CM | POA: Diagnosis not present

## 2023-10-03 ENCOUNTER — Ambulatory Visit: Payer: Self-pay | Admitting: Internal Medicine

## 2023-10-03 ENCOUNTER — Encounter: Payer: Self-pay | Admitting: Internal Medicine

## 2023-10-03 LAB — HM MAMMOGRAPHY

## 2023-10-23 ENCOUNTER — Other Ambulatory Visit: Payer: Self-pay | Admitting: Internal Medicine

## 2023-11-16 DIAGNOSIS — Z961 Presence of intraocular lens: Secondary | ICD-10-CM | POA: Diagnosis not present

## 2023-11-16 DIAGNOSIS — H5211 Myopia, right eye: Secondary | ICD-10-CM | POA: Diagnosis not present

## 2023-11-21 DIAGNOSIS — E042 Nontoxic multinodular goiter: Secondary | ICD-10-CM | POA: Diagnosis not present

## 2023-11-21 DIAGNOSIS — E041 Nontoxic single thyroid nodule: Secondary | ICD-10-CM | POA: Diagnosis not present

## 2023-11-28 DIAGNOSIS — E041 Nontoxic single thyroid nodule: Secondary | ICD-10-CM | POA: Diagnosis not present

## 2023-12-28 ENCOUNTER — Other Ambulatory Visit

## 2023-12-28 ENCOUNTER — Encounter: Payer: Self-pay | Admitting: Internal Medicine

## 2023-12-28 DIAGNOSIS — E785 Hyperlipidemia, unspecified: Secondary | ICD-10-CM

## 2023-12-28 DIAGNOSIS — R7301 Impaired fasting glucose: Secondary | ICD-10-CM

## 2024-01-24 ENCOUNTER — Other Ambulatory Visit (INDEPENDENT_AMBULATORY_CARE_PROVIDER_SITE_OTHER)

## 2024-01-24 ENCOUNTER — Encounter: Payer: Self-pay | Admitting: Internal Medicine

## 2024-01-24 DIAGNOSIS — E785 Hyperlipidemia, unspecified: Secondary | ICD-10-CM

## 2024-01-24 DIAGNOSIS — R7301 Impaired fasting glucose: Secondary | ICD-10-CM | POA: Diagnosis not present

## 2024-01-24 LAB — COMPREHENSIVE METABOLIC PANEL WITH GFR
ALT: 13 U/L (ref 0–35)
AST: 18 U/L (ref 0–37)
Albumin: 4.1 g/dL (ref 3.5–5.2)
Alkaline Phosphatase: 35 U/L — ABNORMAL LOW (ref 39–117)
BUN: 19 mg/dL (ref 6–23)
CO2: 29 meq/L (ref 19–32)
Calcium: 9 mg/dL (ref 8.4–10.5)
Chloride: 104 meq/L (ref 96–112)
Creatinine, Ser: 0.71 mg/dL (ref 0.40–1.20)
GFR: 80 mL/min (ref >60.00)
Glucose, Bld: 90 mg/dL (ref 70–99)
Potassium: 4.4 meq/L (ref 3.5–5.1)
Sodium: 139 meq/L (ref 135–145)
Total Bilirubin: 0.5 mg/dL (ref 0.2–1.2)
Total Protein: 6.7 g/dL (ref 6.0–8.3)

## 2024-01-24 LAB — HEMOGLOBIN A1C: Hgb A1c MFr Bld: 6.5 % (ref 4.6–6.5)

## 2024-01-24 LAB — LIPID PANEL
Cholesterol: 251 mg/dL — ABNORMAL HIGH (ref 0–200)
HDL: 55.4 mg/dL (ref >39.00)
LDL Cholesterol: 158 mg/dL — ABNORMAL HIGH (ref 0–99)
NonHDL: 195.1
Total CHOL/HDL Ratio: 5
Triglycerides: 187 mg/dL — ABNORMAL HIGH (ref 0.0–149.0)
VLDL: 37.4 mg/dL (ref 0.0–40.0)

## 2024-01-25 ENCOUNTER — Ambulatory Visit: Payer: Self-pay | Admitting: Internal Medicine

## 2024-02-18 ENCOUNTER — Other Ambulatory Visit: Payer: Self-pay | Admitting: Internal Medicine

## 2024-02-29 ENCOUNTER — Ambulatory Visit: Admitting: Internal Medicine

## 2024-03-07 DIAGNOSIS — Z79899 Other long term (current) drug therapy: Secondary | ICD-10-CM | POA: Diagnosis not present

## 2024-03-07 DIAGNOSIS — I10 Essential (primary) hypertension: Secondary | ICD-10-CM | POA: Diagnosis not present

## 2024-03-07 DIAGNOSIS — R0602 Shortness of breath: Secondary | ICD-10-CM | POA: Diagnosis not present

## 2024-03-07 DIAGNOSIS — R079 Chest pain, unspecified: Secondary | ICD-10-CM | POA: Diagnosis not present

## 2024-03-14 ENCOUNTER — Ambulatory Visit: Admitting: Internal Medicine

## 2024-03-14 ENCOUNTER — Encounter: Payer: Self-pay | Admitting: Internal Medicine

## 2024-03-14 VITALS — BP 164/80 | HR 71 | Temp 97.9°F | Ht 65.0 in | Wt 147.4 lb

## 2024-03-14 DIAGNOSIS — R1319 Other dysphagia: Secondary | ICD-10-CM

## 2024-03-14 DIAGNOSIS — R7301 Impaired fasting glucose: Secondary | ICD-10-CM

## 2024-03-14 DIAGNOSIS — E785 Hyperlipidemia, unspecified: Secondary | ICD-10-CM

## 2024-03-14 DIAGNOSIS — E041 Nontoxic single thyroid nodule: Secondary | ICD-10-CM

## 2024-03-14 DIAGNOSIS — F5101 Primary insomnia: Secondary | ICD-10-CM | POA: Diagnosis not present

## 2024-03-14 DIAGNOSIS — M25511 Pain in right shoulder: Secondary | ICD-10-CM | POA: Diagnosis not present

## 2024-03-14 DIAGNOSIS — I10 Essential (primary) hypertension: Secondary | ICD-10-CM | POA: Diagnosis not present

## 2024-03-14 LAB — LIPID PANEL
Cholesterol: 240 mg/dL — ABNORMAL HIGH (ref 0–200)
HDL: 56.3 mg/dL (ref >39.00)
LDL Cholesterol: 148 mg/dL — ABNORMAL HIGH (ref 0–99)
NonHDL: 184.02
Total CHOL/HDL Ratio: 4
Triglycerides: 180 mg/dL — ABNORMAL HIGH (ref 0.0–149.0)
VLDL: 36 mg/dL (ref 0.0–40.0)

## 2024-03-14 LAB — T4, FREE: Free T4: 0.73 ng/dL (ref 0.60–1.60)

## 2024-03-14 LAB — TSH: TSH: 1.25 u[IU]/mL (ref 0.35–5.50)

## 2024-03-14 MED ORDER — HYDROXYZINE HCL 10 MG PO TABS: 5.0000 mg | ORAL_TABLET | Freq: Two times a day (BID) | ORAL | 0 refills | Status: DC | PRN

## 2024-03-14 NOTE — Progress Notes (Unsigned)
 Subjective:   Patient ID: [name], female    DOB: 11-11-1940, 83 y.o.   MRN: [id number]  Discussed the use of AI scribe software for clinical note transcription with the patient, who gave verbal consent to proceed.  History of Present Illness [name] is an 83 year old female with hypertension and thyroid  nodules who presents for re-evaluation of A1c, cholesterol, and thyroid  function.  She is taking egg shell membrane and true niacin (300 mg) for cholesterol and aging. She questions if these supplements could be affecting her blood pressure.  She is concerned about her thyroid  function as she has a thyroid  nodule that has spread from the left to the right side and has grown. She feels very tired and has experienced gradual weight loss, currently weighing 146 pounds, down from 150 pounds.  She recently experienced a hypertensive crisis while traveling to [place] with her husband and daughter. She felt dizzy and uncomfortable, and her blood pressure was 195 mmHg. She took clonidine , but it did not lower her blood pressure. She was hesitant to take amlodipine  due to concerns about interactions with other medications. Her blood pressure remained high, and she experienced mild chest pain, which worsened the next day. She went to the emergency room at [facility], where her blood pressure was 227 mmHg. After resting, it decreased to 165 mmHg. She was given amlodipine , which helped reduce her blood pressure to 165 mmHg again. She has since started taking amlodipine  at home, and her blood pressure has been in the 120s over the weekend.  She has difficulty swallowing at times, with food feeling stuck until she drinks water. She previously saw Dr. Justice for this issue but has since switched to Dr. Aulo.  She experiences difficulty sleeping when changing beds or traveling, feeling nervous and unable to relax. She believes this may contribute to her elevated blood pressure during  travel. She inquires about medication to help with sleep and anxiety during travel.  She woke up with shoulder pain yesterday, describing it as a throbbing pain that comes and goes. She applied Salonpas, which did not help, and used a heating pad. She wonders if it could be related to rheumatoid arthritis, as she also experiences pain in her fingers.  Review of Systems  Objective:  Physical Exam  Vitals:   03/14/24 1040 03/14/24 1047  BP: (!) 170/80 (!) 164/80  Pulse: 71   Temp: 97.9 F (36.6 C)   TempSrc: Oral   SpO2: 99%   Weight: 147 lb 6.4 oz (66.9 kg)   Height: 5' 5 (1.651 m)     Assessment and Plan Assessment & Plan Hypertension   Recent hypertensive urgency was managed with amlodipine  and candesartan , maintaining blood pressure in the 120s. Anxiety during travel may have contributed to elevated levels. Continue amlodipine  and candesartan . Monitor blood pressure regularly and report significant changes.  Thyroid  nodule, bilateral   Bilateral thyroid  nodules have shown recent growth. Reassessment is planned for January with a potential biopsy if growth continues. Check thyroid  function tests today and reassess nodules in January.  Hyperlipidemia   She is taking True Niagen for cholesterol management, but no recent cholesterol levels were discussed. Check cholesterol levels today.  Impaired fasting sugar   There is concern about blood sugar levels. A1c was last checked in September and is not due until December. Order a fructosamine test today to assess short-term blood sugar control.  Dysphagia   She experiences intermittent dysphagia with a sensation  of food getting stuck, relieved by drinking water. Refer to Dr. Aulo for further evaluation.  Insomnia and situational anxiety   She has difficulty sleeping and increased anxiety during travel, potentially contributing to elevated blood pressure. Prescribe hydroxyzine 10 mg, take half a tablet as needed for anxiety or sleep  during travel.  Right shoulder myalgia   She has acute right shoulder pain, likely muscular, with limited relief from Salonpas and a heating pad. Continue using the heating pad for symptomatic relief.  General Health Maintenance   No specific discussions on general health maintenance, screenings, or vaccinations during this visit.

## 2024-03-14 NOTE — Patient Instructions (Addendum)
 We will do the labs today. Keep taking the amlodipine  and the candesartan .  We have sent hydroxyzine to use for anxiety or sleep with traveling. Start with 1/2 pill and see if this works.

## 2024-03-15 DIAGNOSIS — M25511 Pain in right shoulder: Secondary | ICD-10-CM | POA: Insufficient documentation

## 2024-03-15 DIAGNOSIS — R1319 Other dysphagia: Secondary | ICD-10-CM | POA: Insufficient documentation

## 2024-03-15 NOTE — Assessment & Plan Note (Signed)
 There is concern about blood sugar levels. A1c was last checked in September and is not due until December. Order a fructosamine test today to assess short-term blood sugar control.

## 2024-03-15 NOTE — Assessment & Plan Note (Signed)
 She is taking True Niagen for cholesterol management, but no recent cholesterol levels were discussed. Check cholesterol levels today.

## 2024-03-15 NOTE — Assessment & Plan Note (Signed)
 She has acute right shoulder pain, likely muscular, with limited relief from Salonpas and a heating pad. Continue using the heating pad for symptomatic relief.

## 2024-03-15 NOTE — Assessment & Plan Note (Signed)
 She has difficulty sleeping and increased anxiety during travel, potentially contributing to elevated blood pressure. Prescribe hydroxyzine 10 mg, take half a tablet as needed for anxiety or sleep during travel.

## 2024-03-15 NOTE — Assessment & Plan Note (Signed)
 Bilateral thyroid  nodules have shown recent growth. Reassessment is planned for January with a potential biopsy if growth continues. Check thyroid  function tests today and reassess nodules in January.

## 2024-03-15 NOTE — Assessment & Plan Note (Signed)
 She experiences intermittent dysphagia with a sensation of food getting stuck, relieved by drinking water. Refer to GI for further evaluation.

## 2024-03-15 NOTE — Assessment & Plan Note (Signed)
 Recent hypertensive urgency was managed with amlodipine  and candesartan , maintaining blood pressure in the 120s. Anxiety during travel may have contributed to elevated levels. Continue amlodipine  and candesartan . Monitor blood pressure regularly and report significant changes.

## 2024-03-17 LAB — FRUCTOSAMINE: Fructosamine: 273 umol/L (ref 205–285)

## 2024-03-19 ENCOUNTER — Ambulatory Visit: Payer: Self-pay | Admitting: Internal Medicine

## 2024-04-03 DIAGNOSIS — L309 Dermatitis, unspecified: Secondary | ICD-10-CM | POA: Diagnosis not present

## 2024-04-03 DIAGNOSIS — L608 Other nail disorders: Secondary | ICD-10-CM | POA: Diagnosis not present

## 2024-04-03 DIAGNOSIS — L853 Xerosis cutis: Secondary | ICD-10-CM | POA: Diagnosis not present

## 2024-04-03 DIAGNOSIS — L639 Alopecia areata, unspecified: Secondary | ICD-10-CM | POA: Diagnosis not present

## 2024-04-03 DIAGNOSIS — Z85828 Personal history of other malignant neoplasm of skin: Secondary | ICD-10-CM | POA: Diagnosis not present

## 2024-04-03 DIAGNOSIS — L658 Other specified nonscarring hair loss: Secondary | ICD-10-CM | POA: Diagnosis not present

## 2024-04-12 ENCOUNTER — Other Ambulatory Visit: Payer: Self-pay | Admitting: Internal Medicine

## 2024-05-02 ENCOUNTER — Encounter: Payer: Self-pay | Admitting: Internal Medicine

## 2024-05-02 ENCOUNTER — Ambulatory Visit

## 2024-05-02 ENCOUNTER — Ambulatory Visit: Admitting: Internal Medicine

## 2024-05-02 VITALS — BP 160/80 | HR 73 | Temp 98.2°F | Ht 65.0 in | Wt 145.8 lb

## 2024-05-02 DIAGNOSIS — M549 Dorsalgia, unspecified: Secondary | ICD-10-CM | POA: Insufficient documentation

## 2024-05-02 DIAGNOSIS — R1319 Other dysphagia: Secondary | ICD-10-CM | POA: Diagnosis not present

## 2024-05-02 DIAGNOSIS — E041 Nontoxic single thyroid nodule: Secondary | ICD-10-CM

## 2024-05-02 DIAGNOSIS — I10 Essential (primary) hypertension: Secondary | ICD-10-CM

## 2024-05-02 NOTE — Assessment & Plan Note (Signed)
 Blood pressure was elevated at the visit, possibly related to stress and a missed dose of medication. Continue the current antihypertensive regimen as her home readings are at goal.

## 2024-05-02 NOTE — Assessment & Plan Note (Signed)
 A follow-up ultrasound is scheduled. I do not think this is related to her dysphagia. Continue with the scheduled thyroid  ultrasound on January 5th.

## 2024-05-02 NOTE — Patient Instructions (Addendum)
 You can call [facility] to schedule [facility] [address] [place] [address] [phone]  We will check the x-ray of the back to check the pain.

## 2024-05-02 NOTE — Progress Notes (Signed)
 Subjective:   Patient ID: [name], female    DOB: [birth year], 83 y.o.   MRN: [id number]  Discussed the use of AI scribe software for clinical note transcription with the patient, who gave verbal consent to proceed.  History of Present Illness [name] is an 83 year old female who presents with persistent shoulder blade pain and esophageal discomfort.  She experiences persistent dull pain in the shoulder blade area, which was previously intermittent but has now become constant. The pain is dull and uncomfortable, not sharp, and does not worsen with movement or pressure. She occasionally takes Tylenol  for relief but prefers to avoid medication.  She has difficulty swallowing, with food feeling stuck in her esophagus, requiring water to facilitate swallowing. This issue has been persistent and worsening over time. She has not yet seen the gastroenterologist due to scheduling conflicts and is awaiting rescheduling of her appointment.  She mentions a thyroid  nodule and has an ultrasound scheduled for January 5th as part of regular follow-up. She is unsure if this is related to her current symptoms.  She reports unintentional weight loss of about five pounds since April, despite not being on a diet and trying to eat normally. She also feels unusually tired and fatigued, needing to rest after minimal activity, which is a change from her usual energetic self.  Her blood pressure is generally well-controlled at home.  Review of Systems  Constitutional: Negative.   HENT: Negative.    Eyes: Negative.   Respiratory:  Negative for cough, chest tightness and shortness of breath.   Cardiovascular:  Negative for chest pain, palpitations and leg swelling.  Gastrointestinal:  Negative for abdominal distention, abdominal pain, constipation, diarrhea, nausea and vomiting.  Musculoskeletal:  Positive for back pain.  Skin: Negative.   Neurological: Negative.   Psychiatric/Behavioral: Negative.       Objective:  Physical Exam Constitutional:      Appearance: She is well-developed.  HENT:     Head: Normocephalic and atraumatic.  Cardiovascular:     Rate and Rhythm: Normal rate and regular rhythm.  Pulmonary:     Effort: Pulmonary effort is normal. No respiratory distress.     Breath sounds: Normal breath sounds. No wheezing or rales.  Abdominal:     General: Bowel sounds are normal. There is no distension.     Palpations: Abdomen is soft.     Tenderness: There is no abdominal tenderness.  Musculoskeletal:        General: Tenderness present.     Cervical back: Normal range of motion.     Comments: Pain superior left scapular region not worse with palpation or movement  Skin:    General: Skin is warm and dry.  Neurological:     Mental Status: She is alert and oriented to person, place, and time.     Coordination: Coordination normal.     Vitals:   05/02/24 1034 05/02/24 1043  BP: (!) 170/80 (!) 160/80  Pulse: 73   Temp: 98.2 F (36.8 C)   TempSrc: Oral   SpO2: 98%   Weight: 145 lb 12.8 oz (66.1 kg)   Height: 5' 5 (1.651 m)     Assessment and Plan Assessment & Plan Mid back pain   Persistent dull pain in the mid back, particularly in the shoulder blade area, is constant and occasionally requires Tylenol . Ordered an x-ray of the mid back and cervical region to evaluate for bone or issues.  Esophageal Dysphagia   Chronic dysphagia  with a sensation of food impaction is worsening. Discussed potential esophageal dilation. Advised rescheduling an appointment with a gastroenterologist for further evaluation and possible dilation, given scheduling information.  Thyroid  nodule   A follow-up ultrasound is scheduled. I do not think this is related to her dysphagia. Continue with the scheduled thyroid  ultrasound on January 5th.  Hypertension   Blood pressure was elevated at the visit, possibly related to stress and a missed dose of medication. Continue the current  antihypertensive regimen as her home readings are at goal.

## 2024-05-02 NOTE — Assessment & Plan Note (Signed)
 Persistent dull pain in the mid back, particularly in the shoulder blade area, is constant and occasionally requires Tylenol . Ordered an x-ray of the mid back and cervical region to evaluate for bone or issues.

## 2024-05-02 NOTE — Assessment & Plan Note (Signed)
 Chronic dysphagia with a sensation of food impaction is worsening. Discussed potential esophageal dilation. Advised rescheduling an appointment with a gastroenterologist for further evaluation and possible dilation, given scheduling information.

## 2024-05-16 ENCOUNTER — Ambulatory Visit: Payer: Self-pay | Admitting: Internal Medicine

## 2024-06-15 ENCOUNTER — Encounter (HOSPITAL_BASED_OUTPATIENT_CLINIC_OR_DEPARTMENT_OTHER): Payer: Self-pay

## 2024-06-18 ENCOUNTER — Encounter (HOSPITAL_BASED_OUTPATIENT_CLINIC_OR_DEPARTMENT_OTHER): Payer: Self-pay | Admitting: Cardiology

## 2024-06-18 ENCOUNTER — Ambulatory Visit (HOSPITAL_BASED_OUTPATIENT_CLINIC_OR_DEPARTMENT_OTHER): Admitting: Cardiology

## 2024-06-18 VITALS — BP 180/88 | HR 80 | Ht 65.0 in | Wt 151.0 lb

## 2024-06-18 DIAGNOSIS — E782 Mixed hyperlipidemia: Secondary | ICD-10-CM | POA: Diagnosis not present

## 2024-06-18 DIAGNOSIS — I10 Essential (primary) hypertension: Secondary | ICD-10-CM | POA: Diagnosis not present

## 2024-06-18 DIAGNOSIS — R011 Cardiac murmur, unspecified: Secondary | ICD-10-CM

## 2024-06-18 DIAGNOSIS — R0789 Other chest pain: Secondary | ICD-10-CM | POA: Diagnosis not present

## 2024-06-18 DIAGNOSIS — M25512 Pain in left shoulder: Secondary | ICD-10-CM

## 2024-06-18 DIAGNOSIS — Z7689 Persons encountering health services in other specified circumstances: Secondary | ICD-10-CM

## 2024-06-18 DIAGNOSIS — I2510 Atherosclerotic heart disease of native coronary artery without angina pectoris: Secondary | ICD-10-CM

## 2024-06-18 DIAGNOSIS — G8929 Other chronic pain: Secondary | ICD-10-CM

## 2024-06-18 NOTE — Progress Notes (Signed)
 " Cardiology Office Note:  .   Date:  06/18/2024  ID:  [name], [name] Sep 17, 1940, MRN [id number] PCP: Rollene Almarie LABOR, MD  [facility] Providers Cardiologist:  Shelda Bruckner, MD {  History of Present Illness: .   [name] is a 84 y.o. female with PMH nonobstructive CAD, hyperlipidemia, hypertension, statin intolerance. She was remotely seen by Dr. Lavona in the past but has not been seen since 2022. She is seen as a new patient evaluation today.  Pertinent CV history: Cath in 2003 with 50% stenosis in PDA and 30% stenosis in diagonal. Nonischemic treadmill 2017. Reported chest pain to Dr. Lavona at visit in 2022, lexiscan myoview ordered but not completed.  Today: Blood pressure is usually around 135 systolic. If it is less than 135, she does not take her amlodipine . She notes that she may take amlodipine  for a week or so if blood pressure elevated, then tries not to take it. Has not taken amlodipine  in about a week.   She was told by her endocrinologist at [facility] that she had a murmur. Recommended to see a cardiologist.   Had an episode in [place] when traveling in [place], had hypertensive urgency, went to [place]. hsTn normal, ECG normal. Treated with amlodipine  and discharged.   No headaches, changes in vision. Has been eating more sodium with the holidays.   ROS positive for left shoulder pain, starting several months ago after pulling a muscle. Felt a little better with topical agents. Has not resolved completely. She had an x-ray with mild degenerative changes. Pain is constant, better with heating pad, worse with certain movements/positions. She is worried and wants to make sure it isn't her heart.  ROS also notes focal area in the center of her chest about 1-2 times/month after eating, related to feeling her food get stuck in her throat. Saw Dr. Burnette at [facility], discussed barium swallow vs. EGD. She did not want to pursue this right now.  ROS:  Denies shortness of breath at rest or with normal exertion. No PND, orthopnea, LE edema or unexpected weight gain. No syncope or palpitations. ROS otherwise negative except as noted.   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 1/6 systolic murmur. VASCULAR: Radial and DP pulses 2+ bilaterally. No carotid bruits RESPIRATORY:  Clear to auscultation without rales, wheezing or rhonchi  ABDOMEN: Soft, non-tender, non-distended MUSCULOSKELETAL:  Ambulates independently. Tender to palpation around scapula on left SKIN: Warm and dry, no edema NEUROLOGIC:  Alert and oriented x 3. No focal neuro deficits noted. PSYCHIATRIC:  Normal affect  ASSESSMENT AND PLAN: .    L shoulder pain, constant Intermittent central/focal chest discomfort related to food -not consistent with cardiac etiology based on symptoms, timeline -L shoulder tender to palpation today, discussed conservative management -has seen GI, declined barium swallow and EGD at this time -reviewed red flag warning signs that need immediate medical attention  Murmur -no high risk features. We discussed, she is amenable to check echocardiogram, ordered  Nonobstructive CAD Hyperlipidemia, mixed Statin  intolerance -on fenofibrate  -lipids per [facility] 02/2024: Tchol 240, HDL 56, LDL 148, TG 180 -discussed today, she does not want additional medications, wants to work on lifestyle -she declines [id number]  Hypertension -on manual check, she has audible sounds at 192 systolic, then no audible sounds until 145 systolic, consistent until range in upper 70s -recommend that she have manual cuff check in office. Discussed that automatic cuff won't detect this typically -on amlodipine  2.5 mg daily PRN, candesartan  (total dose 24 mg daily) -recommend she take both amlodipine  and candesartan , watch her sodium (increased over the holidays), and monitor home BP  CV risk counseling and prevention -recommend heart healthy/Mediterranean diet, with whole grains, fruits, vegetable, fish, lean meats, nuts, and olive oil. Limit salt. -recommend moderate walking, 3-5 times/week for 30-50 minutes each session. Aim for at least 150 minutes/week. Goal should be pace of 3 miles/hours, or walking 1.5 miles in 30 minutes -recommend avoidance of tobacco products. Avoid excess alcohol.  Dispo: 1 year or sooner as needed  Signed, Shelda Bruckner, MD   Shelda Bruckner, MD, PhD, [facility]  [facility]    Heart & Vascular at [facility] at [facility] [address], [address] [place], [place] [address] [phone]   "

## 2024-06-18 NOTE — Patient Instructions (Signed)
 Medication Instructions:   Your physician recommends that you continue on your current medications as directed. Please refer to the Current Medication list given to you today.  *If you need a refill on your cardiac medications before your next appointment, please call your pharmacy*   Testing/Procedures:  Your physician has requested that you have an echocardiogram. Echocardiography is a painless test that uses sound waves to create images of your heart. It provides your doctor with information about the size and shape of your heart and how well your hearts chambers and valves are working. This procedure takes approximately one hour. There are no restrictions for this procedure. Please do NOT wear cologne, perfume, aftershave, or lotions (deodorant is allowed). Please arrive 15 minutes prior to your appointment time.  Please note: We ask at that you not bring children with you during ultrasound (echo/ vascular) testing. Due to room size and safety concerns, children are not allowed in the ultrasound rooms during exams. Our front office staff cannot provide observation of children in our lobby area while testing is being conducted. An adult accompanying a patient to their appointment will only be allowed in the ultrasound room at the discretion of the ultrasound technician under special circumstances. We apologize for any inconvenience.   Follow-Up: At [facility], you and your health needs are our priority.  As part of our continuing mission to provide you with exceptional heart care, our providers are all part of one team.  This team includes your primary Cardiologist (physician) and Advanced Practice Providers or APPs (Physician Assistants and Nurse Practitioners) who all work together to provide you with the care you need, when you need it.  Your next appointment:   1 year(s)  Provider:   Shelda Bruckner, MD, Rosaline Bane, NP, or Reche Finder, NP

## 2024-07-18 ENCOUNTER — Other Ambulatory Visit (HOSPITAL_BASED_OUTPATIENT_CLINIC_OR_DEPARTMENT_OTHER)

## 2024-08-16 ENCOUNTER — Ambulatory Visit

## 2024-09-05 ENCOUNTER — Ambulatory Visit
